# Patient Record
Sex: Male | Born: 1940 | Race: White | Hispanic: No | Smoking: Former smoker
Health system: Southern US, Community
[De-identification: ages and names within clinical notes are randomized; demographics above are authoritative.]

## PROBLEM LIST (undated history)

## (undated) DIAGNOSIS — B019 Varicella without complication: Secondary | ICD-10-CM

## (undated) DIAGNOSIS — K2270 Barrett's esophagus without dysplasia: Secondary | ICD-10-CM

## (undated) DIAGNOSIS — M1990 Unspecified osteoarthritis, unspecified site: Secondary | ICD-10-CM

## (undated) DIAGNOSIS — K219 Gastro-esophageal reflux disease without esophagitis: Secondary | ICD-10-CM

## (undated) DIAGNOSIS — K2970 Gastritis, unspecified, without bleeding: Secondary | ICD-10-CM

## (undated) DIAGNOSIS — Z8719 Personal history of other diseases of the digestive system: Secondary | ICD-10-CM

## (undated) DIAGNOSIS — I2510 Atherosclerotic heart disease of native coronary artery without angina pectoris: Secondary | ICD-10-CM

## (undated) DIAGNOSIS — I10 Essential (primary) hypertension: Secondary | ICD-10-CM

## (undated) DIAGNOSIS — T7840XA Allergy, unspecified, initial encounter: Secondary | ICD-10-CM

## (undated) DIAGNOSIS — H9190 Unspecified hearing loss, unspecified ear: Secondary | ICD-10-CM

## (undated) DIAGNOSIS — F419 Anxiety disorder, unspecified: Secondary | ICD-10-CM

## (undated) DIAGNOSIS — E785 Hyperlipidemia, unspecified: Secondary | ICD-10-CM

## (undated) DIAGNOSIS — K5790 Diverticulosis of intestine, part unspecified, without perforation or abscess without bleeding: Secondary | ICD-10-CM

## (undated) DIAGNOSIS — J45909 Unspecified asthma, uncomplicated: Secondary | ICD-10-CM

## (undated) DIAGNOSIS — C801 Malignant (primary) neoplasm, unspecified: Secondary | ICD-10-CM

## (undated) DIAGNOSIS — K649 Unspecified hemorrhoids: Secondary | ICD-10-CM

## (undated) DIAGNOSIS — Z972 Presence of dental prosthetic device (complete) (partial): Secondary | ICD-10-CM

## (undated) DIAGNOSIS — H409 Unspecified glaucoma: Secondary | ICD-10-CM

## (undated) DIAGNOSIS — L509 Urticaria, unspecified: Secondary | ICD-10-CM

## (undated) HISTORY — PX: COLONOSCOPY: SHX5424

## (undated) HISTORY — PX: CARPAL TUNNEL RELEASE: SHX101

## (undated) HISTORY — PX: BACK SURGERY: SHX140

## (undated) HISTORY — PX: LUMBAR SPINE SURGERY: SHX701

---

## 2006-01-14 ENCOUNTER — Ambulatory Visit: Payer: Self-pay | Admitting: Specialist

## 2006-01-14 ENCOUNTER — Other Ambulatory Visit: Payer: Self-pay

## 2006-02-05 ENCOUNTER — Ambulatory Visit: Payer: Self-pay | Admitting: Specialist

## 2007-07-22 ENCOUNTER — Ambulatory Visit: Payer: Self-pay | Admitting: Gastroenterology

## 2009-06-04 ENCOUNTER — Ambulatory Visit: Payer: Self-pay | Admitting: Internal Medicine

## 2009-06-06 ENCOUNTER — Ambulatory Visit: Payer: Self-pay | Admitting: Internal Medicine

## 2009-06-13 ENCOUNTER — Ambulatory Visit: Payer: Self-pay | Admitting: Internal Medicine

## 2009-07-18 ENCOUNTER — Ambulatory Visit: Payer: Self-pay | Admitting: Gastroenterology

## 2010-01-19 IMAGING — RF DG BARIUM SWALLOW
1 series · 15 of 21 positions shown · non-contrast
Comparison: none

REASON FOR EXAM: w tablet   dysphagia
COMMENTS:

PROCEDURE:     FL  - FL BARIUM SWALLOW  - July 18, 2009  [DATE]
RESULT:     Comparison: None
INDICATION: Dysphagia
TECHNIQUE: Multiple single and double-contrast images of the esophagus were
obtained under fluoroscopic guidance. Total fluoroscopy time was 1.0 minutes.

[Series 1: run · 6 acquisitions, 15 frames shown]
[im 1/6]
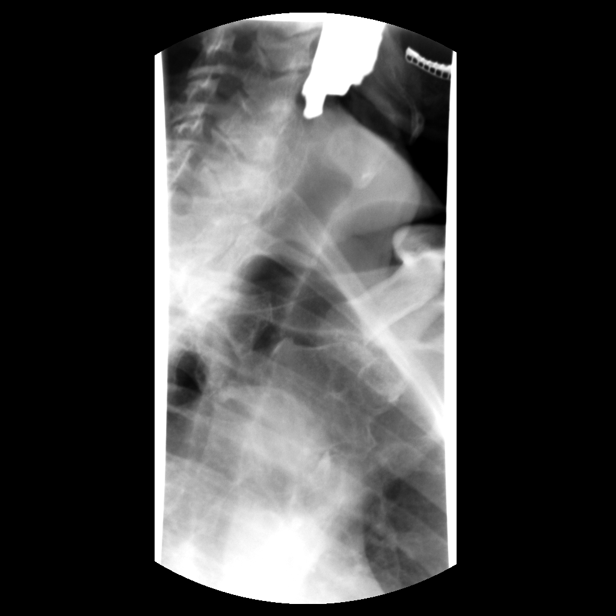
[im 1/6]
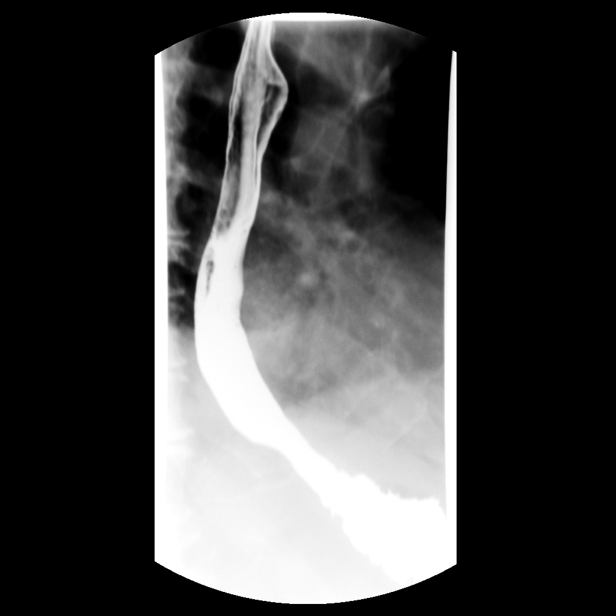
[im 1/6]
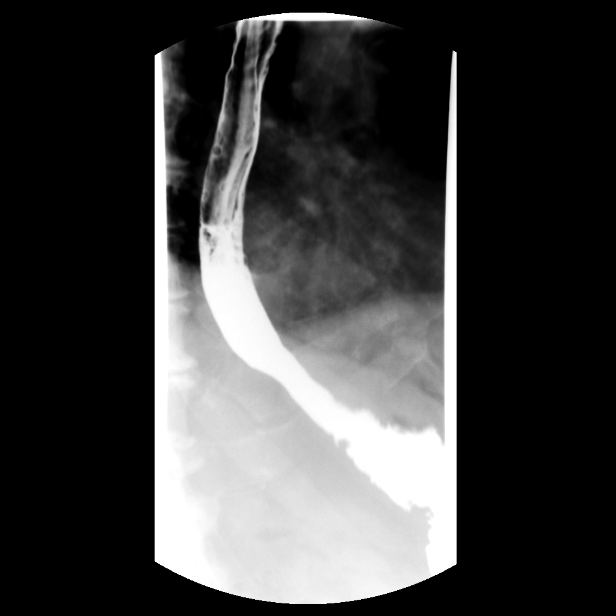
[im 2/6]
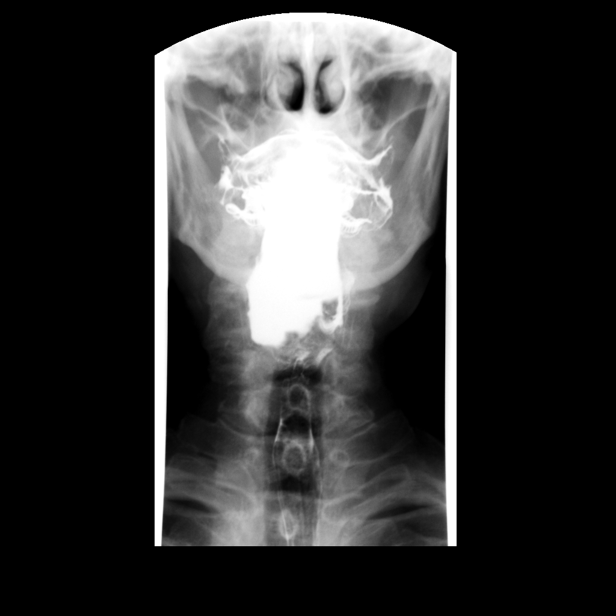
[im 2/6]
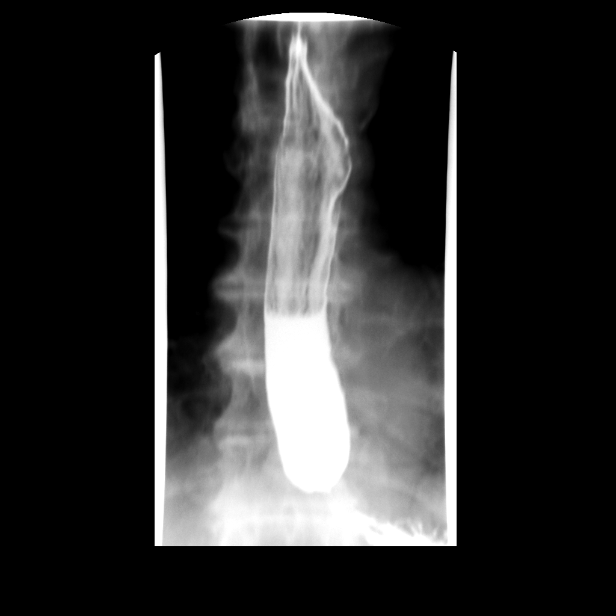
[im 2/6]
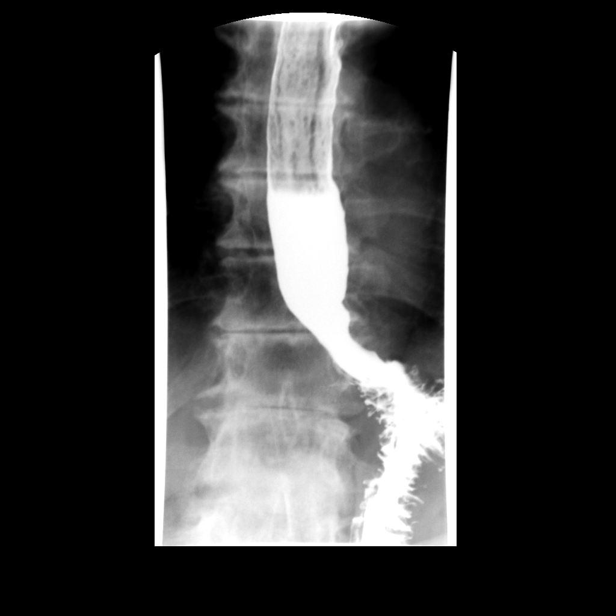
[im 3/6]
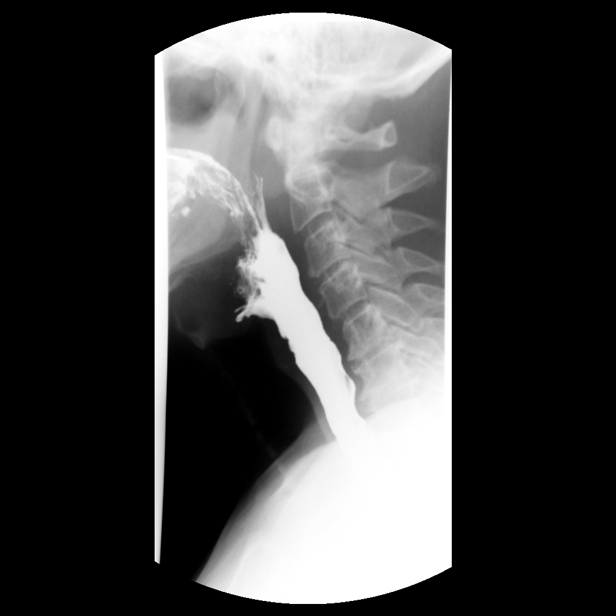
[im 3/6]
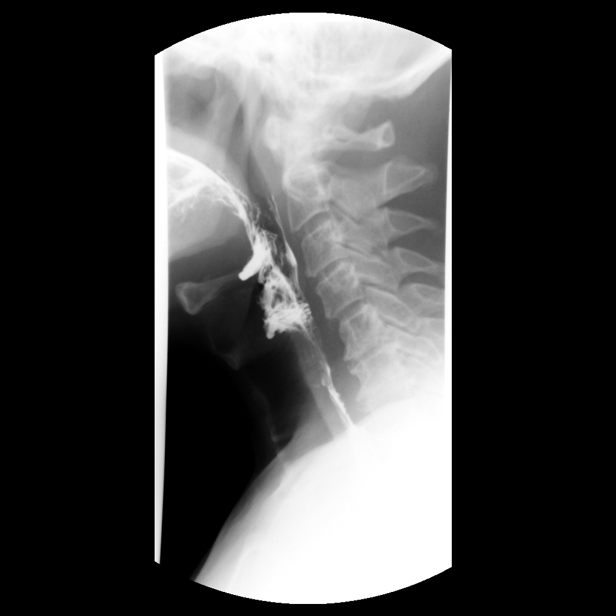
[im 3/6]
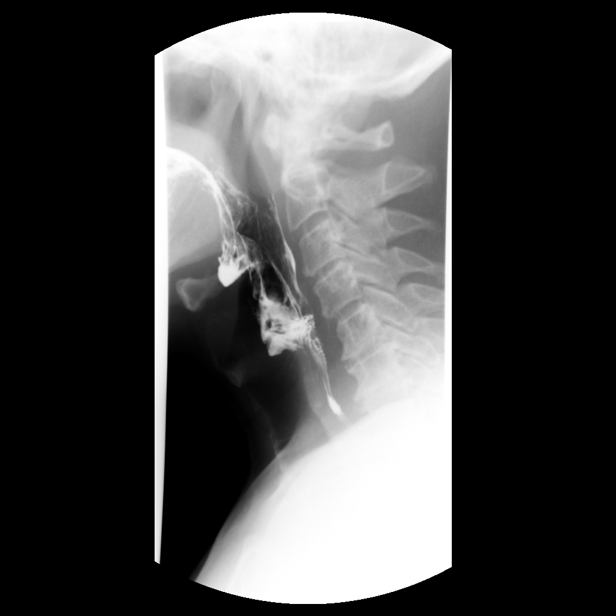
[im 4/6]
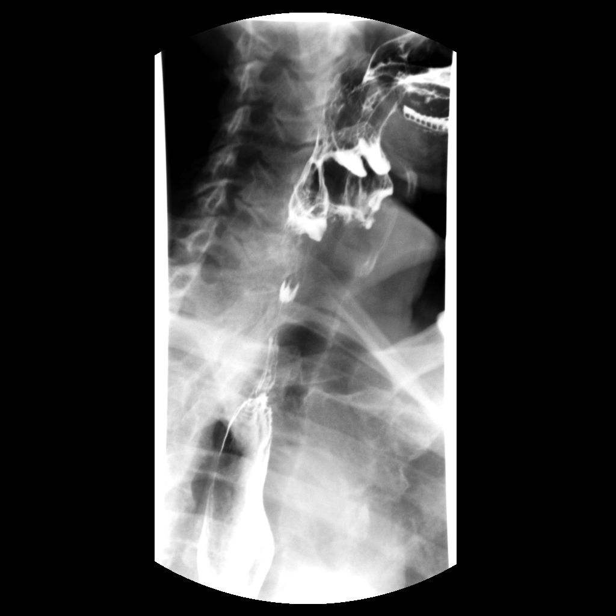
[im 4/6]
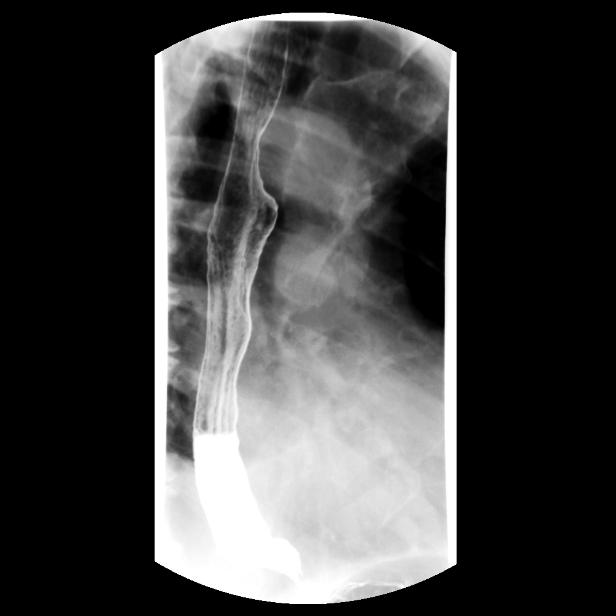
[im 5/6]
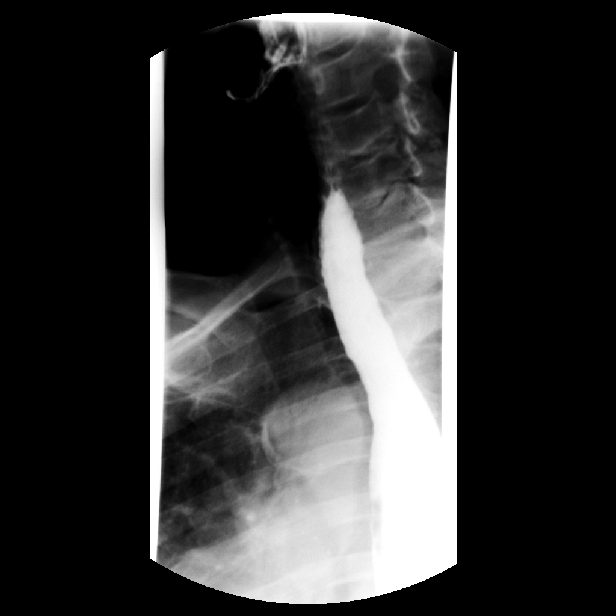
[im 5/6]
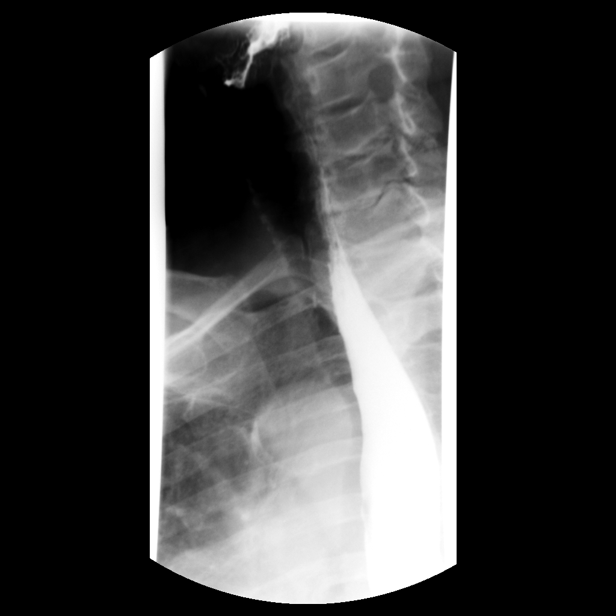
[im 5/6]
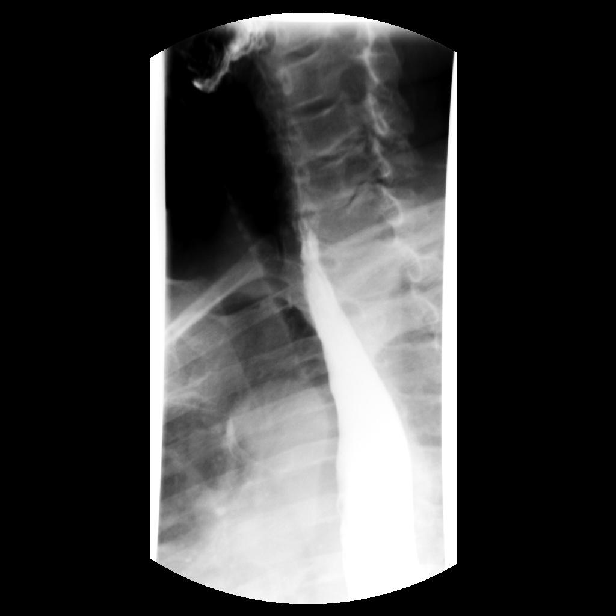
[im 6/6]
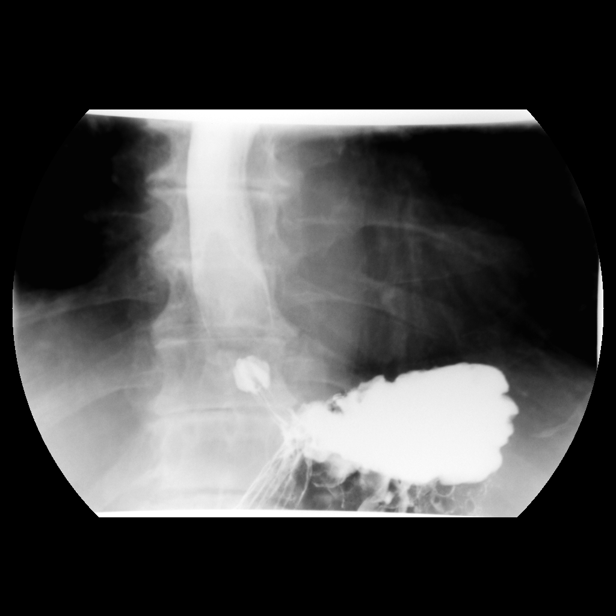

[15 of 21 positions shown; findings below may reference images not displayed]

FINDINGS: There was normal pharyngeal anatomy and motility. Contrast flowed
freely through the esophagus without evidence of stricture or mass. There
was normal esophageal mucosa without evidence of irregularity or ulceration.
 There are tertiary contractions of the distal third of the esophagus. There
is no gastroesophageal reflux. No definite hiatal hernia was demonstrated.

At the end of the examination a 12.5mm barium tablet was administered which
transited through the esophagus and esophagogastric junction without delay.
IMPRESSION: Tertiary contractions of the distal third of the esophagus likely reflecting
presbyesophagus.

## 2010-10-03 ENCOUNTER — Ambulatory Visit: Payer: Self-pay | Admitting: Gastroenterology

## 2010-10-07 LAB — PATHOLOGY REPORT

## 2011-06-12 ENCOUNTER — Emergency Department: Payer: Self-pay | Admitting: Emergency Medicine

## 2011-07-09 ENCOUNTER — Ambulatory Visit: Payer: Self-pay | Admitting: Internal Medicine

## 2011-12-14 IMAGING — CR DG CHEST 2V
1 series · 2 of 2 positions shown · non-contrast
Comparison: none

REASON FOR EXAM: chest pain
COMMENTS:

PROCEDURE:     DXR - DXR CHEST PA (OR AP) AND LATERAL  - June 12, 2011 [DATE]
RESULT:     The lung fields are clear. The heart, mediastinal and osseous
structures show no significant abnormalities.

[Series 1: pa · 0.17mm/px · 2 of 2 slices shown]
[im 1/2]
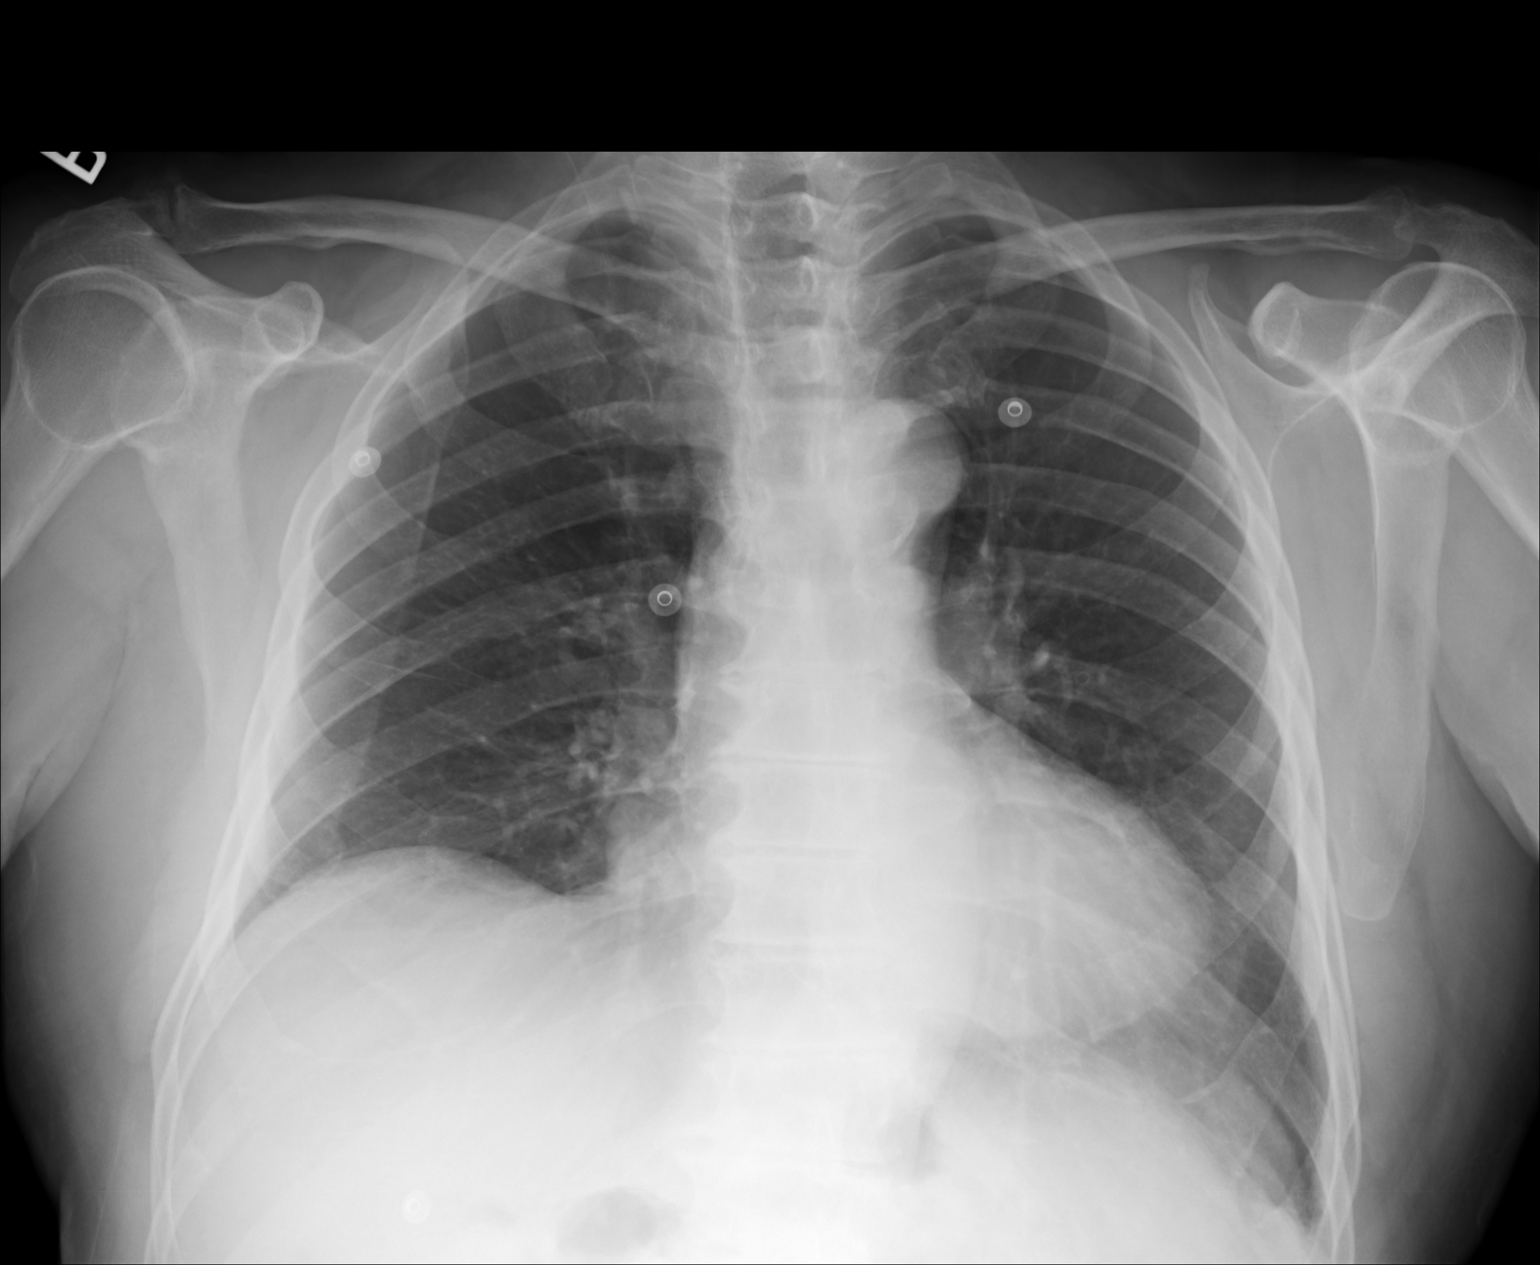
[im 2/2]
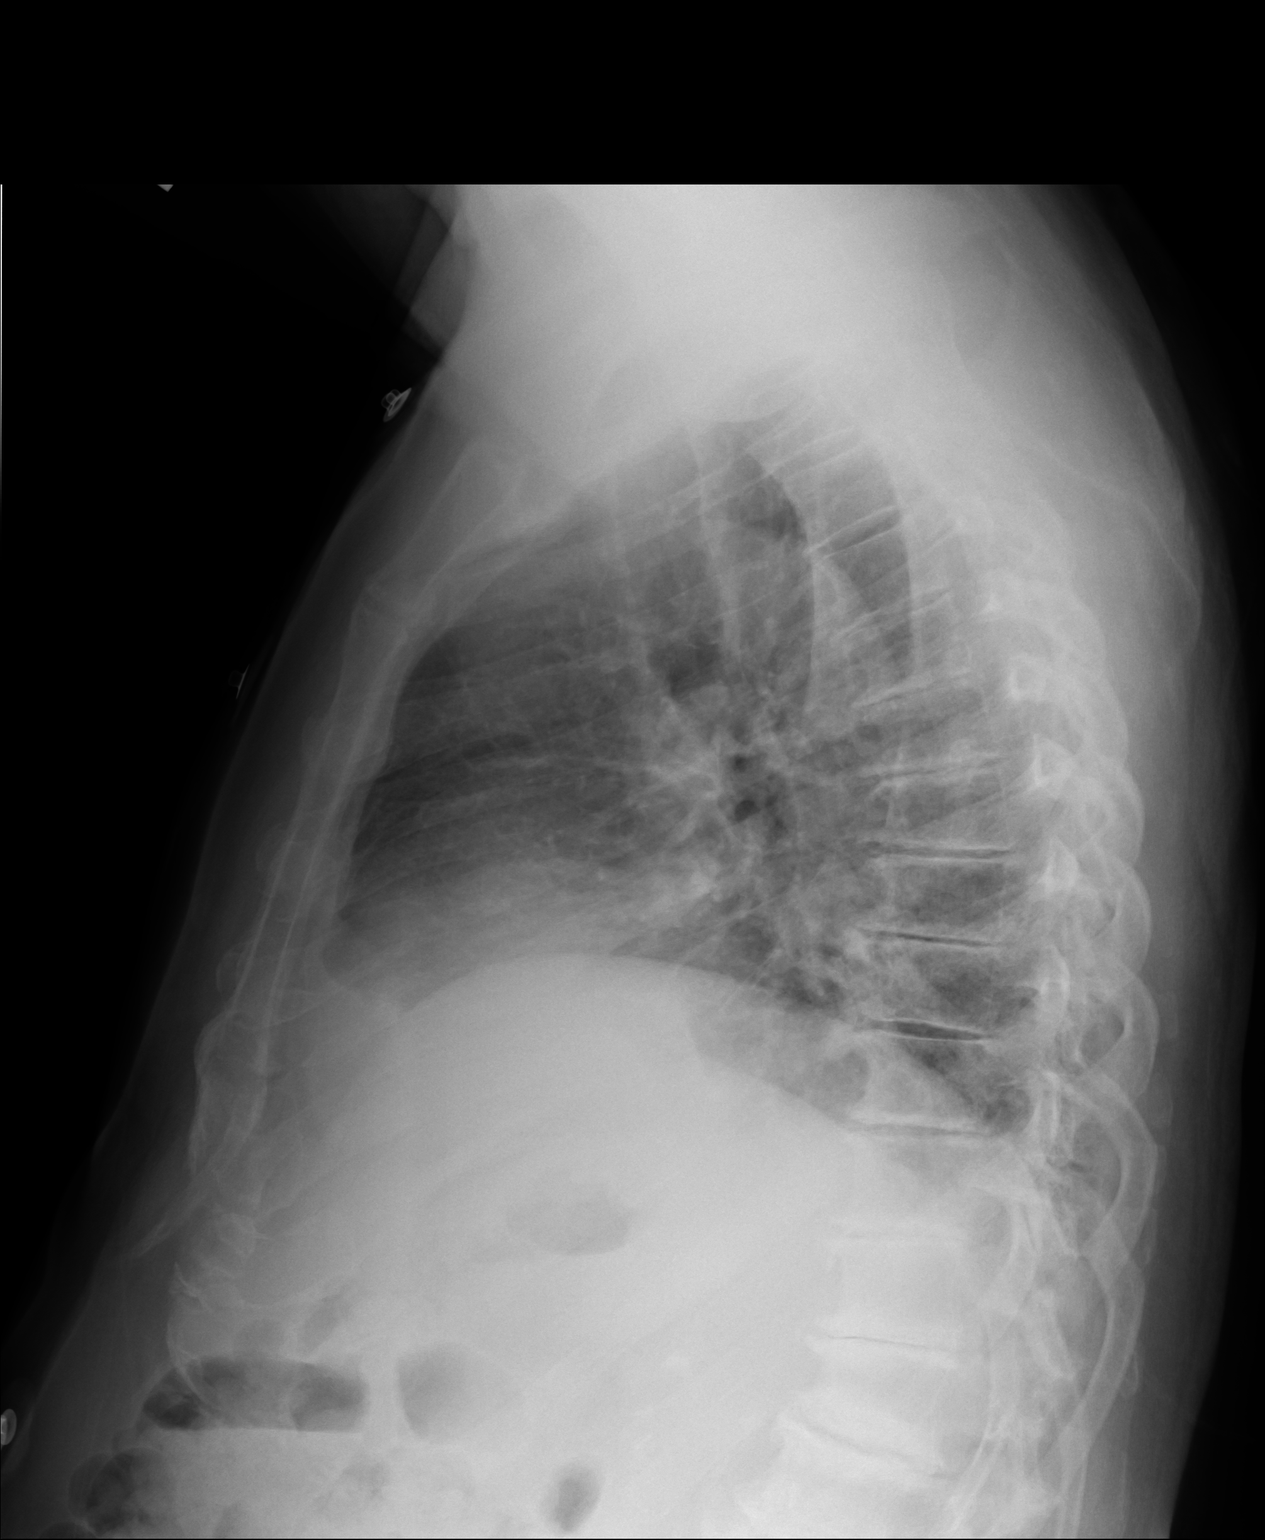

[2 of 2 positions shown; findings below may reference images not displayed]

IMPRESSION: 1.     No significant abnormalities are identified.

## 2014-04-30 ENCOUNTER — Ambulatory Visit: Payer: Self-pay | Admitting: Gastroenterology

## 2014-05-08 ENCOUNTER — Ambulatory Visit: Payer: Self-pay | Admitting: Gastroenterology

## 2014-10-29 LAB — SURGICAL PATHOLOGY

## 2014-11-01 IMAGING — RF DG BARIUM SWALLOW
2 series · 15 of 17 positions shown · non-contrast
Comparison: 07/18/2009

CLINICAL DATA: Dysphasia.

EXAM:
ESOPHOGRAM / BARIUM SWALLOW / BARIUM TABLET STUDY
TECHNIQUE: Combined double contrast and single contrast examination performed
using effervescent crystals, thick barium liquid, and thin barium
liquid. The patient was observed with fluoroscopy swallowing a 13mm
barium sulphate tablet.
FLUOROSCOPY TIME:  Two Min and 0 seconds

[Series 1: fluoro_barium 2fps_bw · 0.20mm/px · 4 of 20 frames shown]
[frame 4/20]
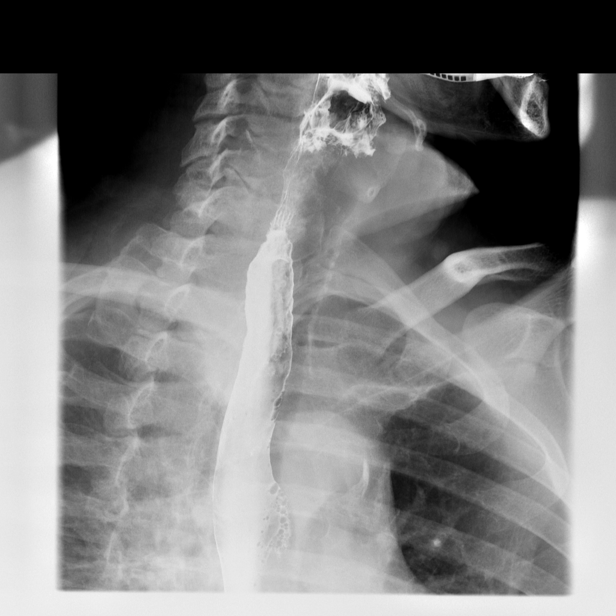
[frame 10/20]
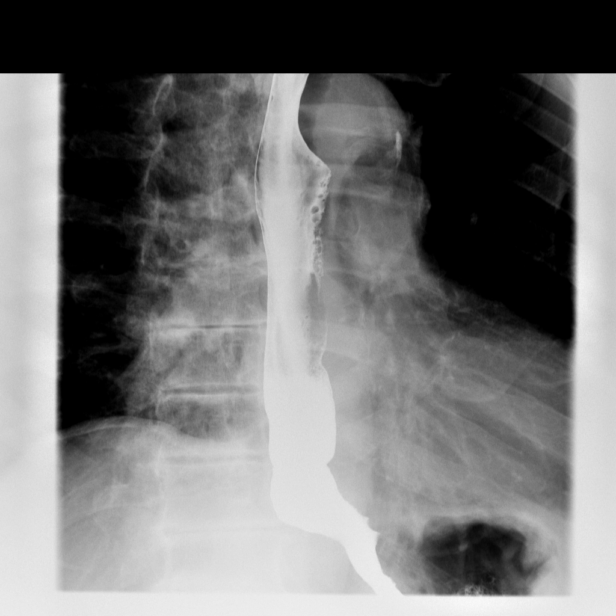
[frame 11/20]
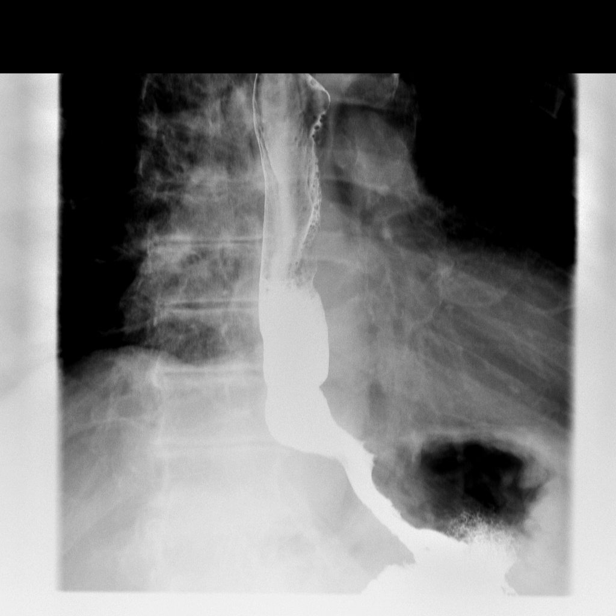
[frame 18/20]
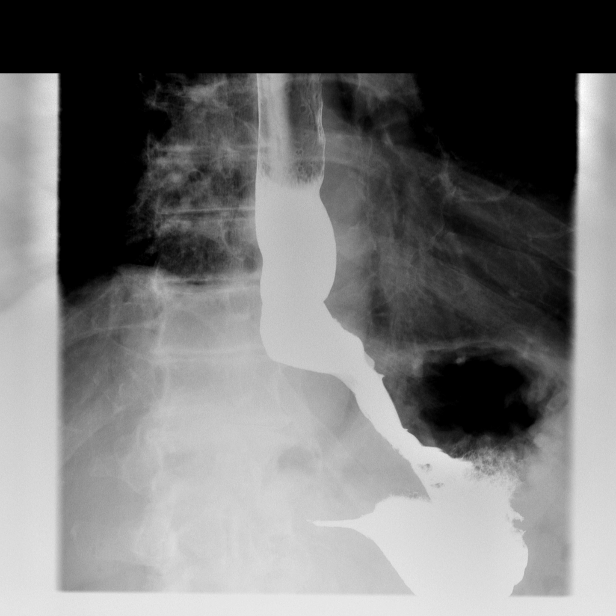

[Series 2: fluoro_barium singleshot_bw · 0.20mm/px · 11 of 13 slices shown]
[im 2/13]
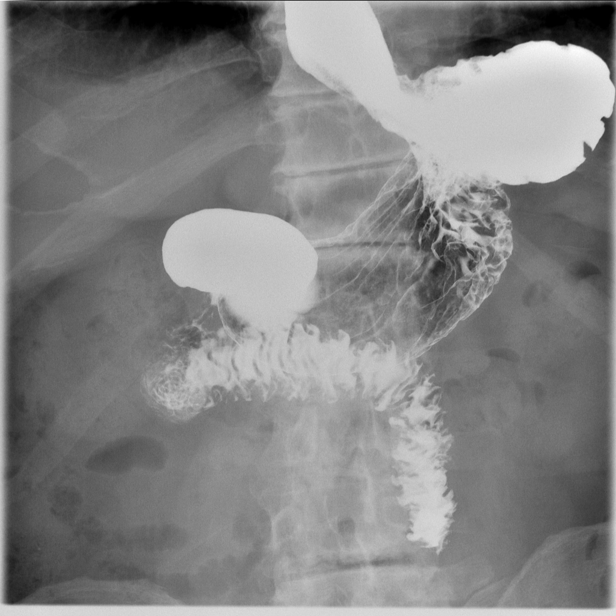
[im 3/13]
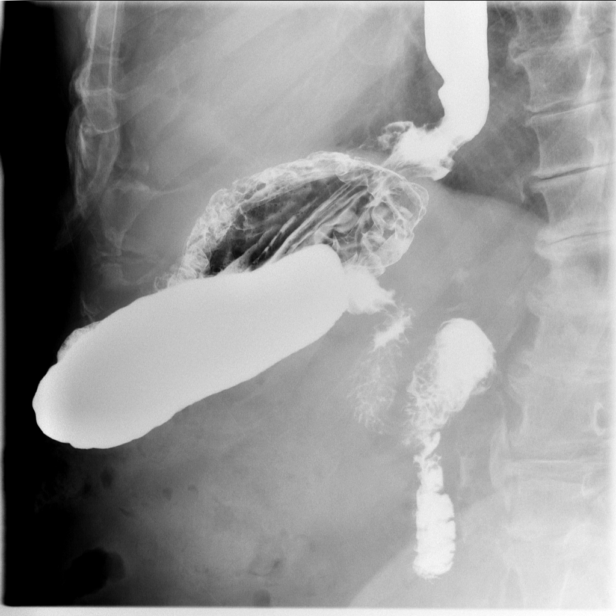
[im 4/13]
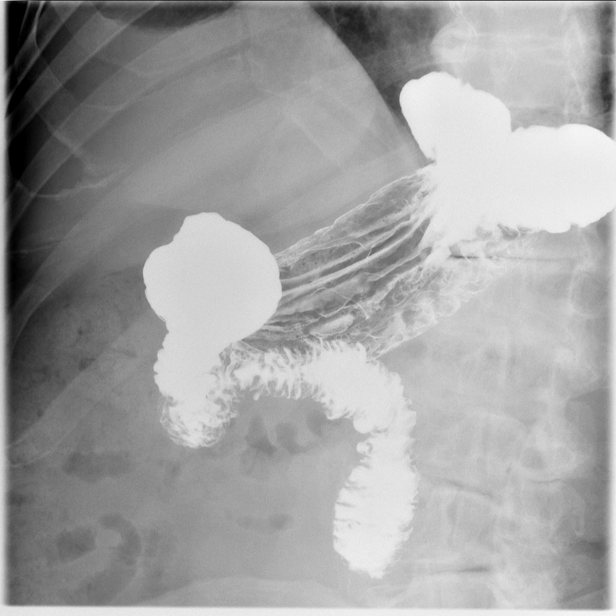
[im 5/13]
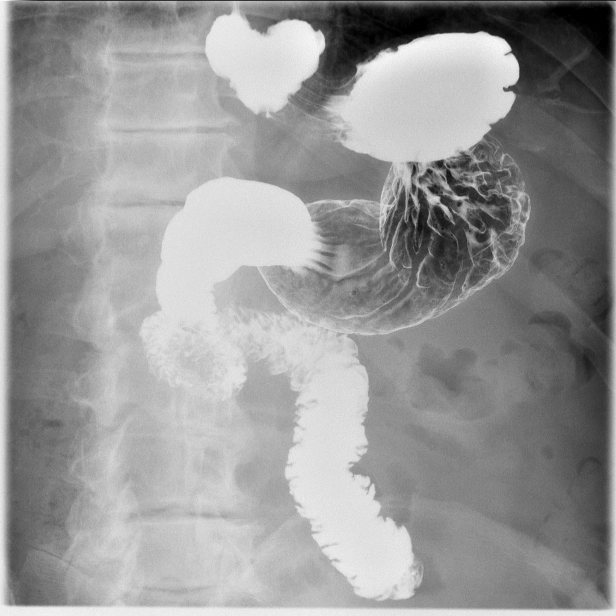
[im 6/13]
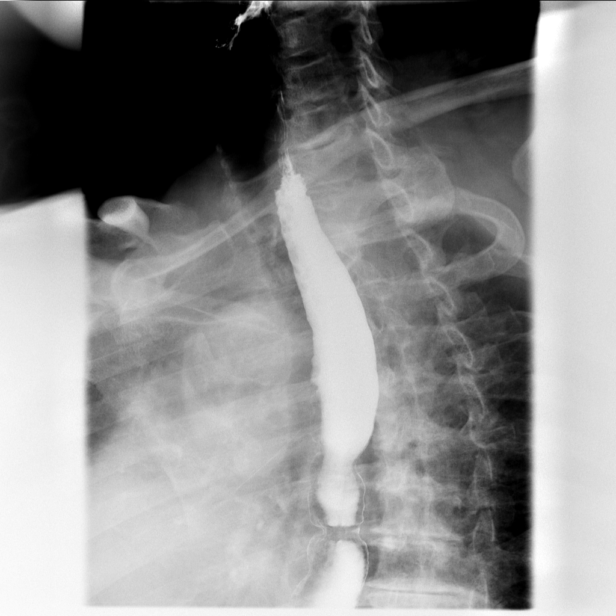
[im 7/13]
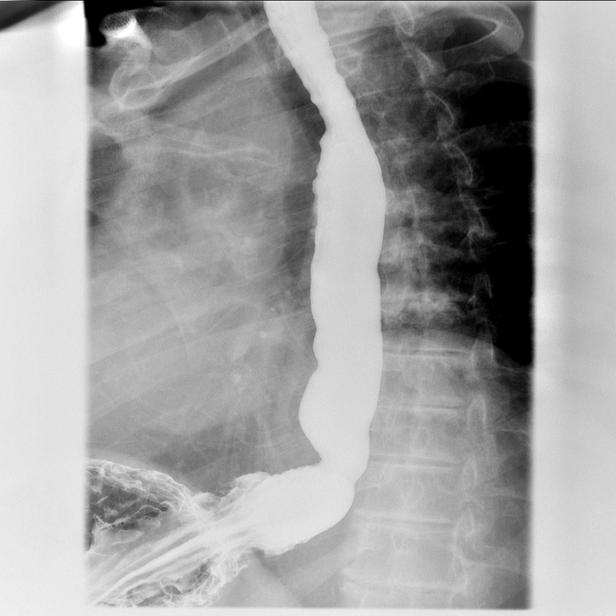
[im 8/13]
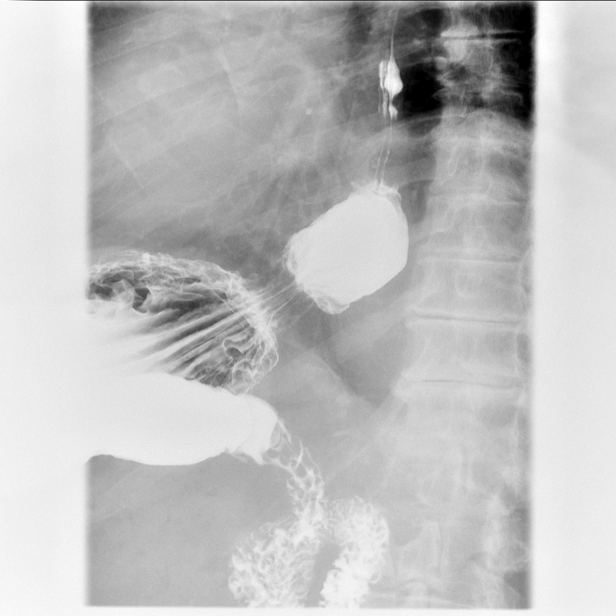
[im 10/13]
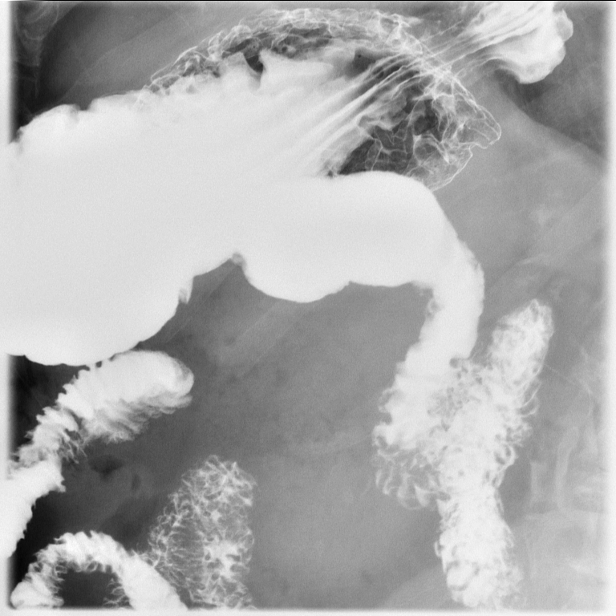
[im 11/13]
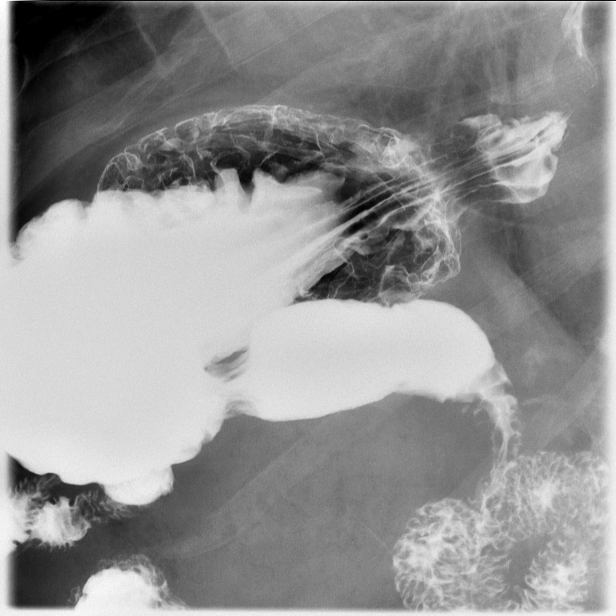
[im 12/13]
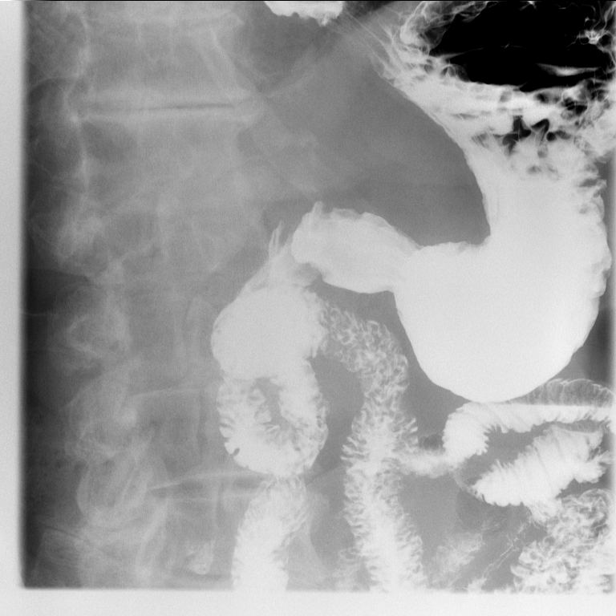
[im 13/13]
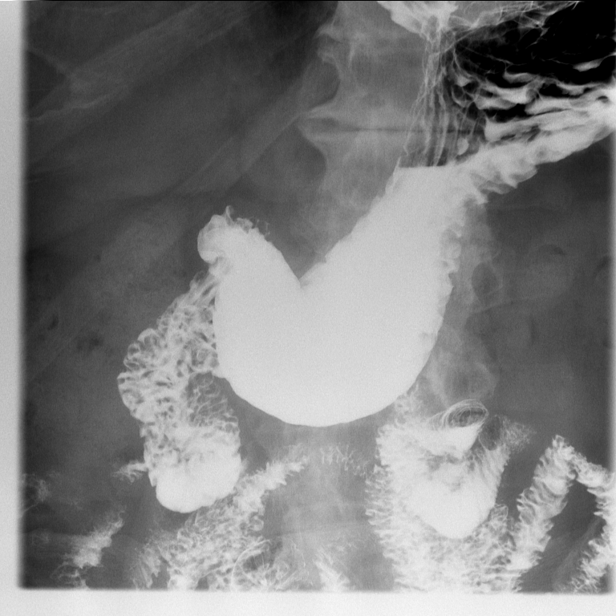

[15 of 17 positions shown; findings below may reference images not displayed]

FINDINGS: Moderately large hiatal hernia with free gastroesophageal reflux to
the mid-proximal esophagus. Decreased esophageal motility. No
esophageal stricture or mass. Barium tablet passed readily into the
stomach without delay

Gastric mucosa is normal. No mucosal edema. No ulcer or mass.
Stomach empties readily into the duodenum which appears normal
without ulceration or edema.
IMPRESSION: Moderately large hiatal hernia with free gastroesophageal reflux.
Decreased esophageal motility. No esophageal stricture.

Negative for peptic ulcer disease.

## 2015-07-15 ENCOUNTER — Ambulatory Visit: Admission: RE | Admit: 2015-07-15 | Payer: Medicare Other | Source: Ambulatory Visit | Admitting: Gastroenterology

## 2015-07-15 ENCOUNTER — Encounter: Admission: RE | Payer: Self-pay | Source: Ambulatory Visit

## 2015-07-15 HISTORY — DX: Anxiety disorder, unspecified: F41.9

## 2015-07-15 HISTORY — DX: Personal history of other diseases of the digestive system: Z87.19

## 2015-07-15 HISTORY — DX: Unspecified osteoarthritis, unspecified site: M19.90

## 2015-07-15 HISTORY — DX: Hyperlipidemia, unspecified: E78.5

## 2015-07-15 HISTORY — DX: Gastro-esophageal reflux disease without esophagitis: K21.9

## 2015-07-15 HISTORY — DX: Allergy, unspecified, initial encounter: T78.40XA

## 2015-07-15 HISTORY — DX: Diverticulosis of intestine, part unspecified, without perforation or abscess without bleeding: K57.90

## 2015-07-15 HISTORY — DX: Essential (primary) hypertension: I10

## 2015-07-15 HISTORY — DX: Urticaria, unspecified: L50.9

## 2015-07-15 HISTORY — DX: Gastritis, unspecified, without bleeding: K29.70

## 2015-07-15 HISTORY — DX: Barrett's esophagus without dysplasia: K22.70

## 2015-07-15 HISTORY — DX: Atherosclerotic heart disease of native coronary artery without angina pectoris: I25.10

## 2015-07-15 HISTORY — DX: Unspecified glaucoma: H40.9

## 2015-07-15 HISTORY — DX: Unspecified hemorrhoids: K64.9

## 2015-07-15 HISTORY — DX: Varicella without complication: B01.9

## 2015-07-15 HISTORY — DX: Malignant (primary) neoplasm, unspecified: C80.1

## 2015-07-15 SURGERY — ESOPHAGOGASTRODUODENOSCOPY (EGD) WITH PROPOFOL
Anesthesia: General

## 2015-08-27 ENCOUNTER — Encounter
Admission: RE | Disposition: A | Discharge status: Home / Self Care | Payer: Self-pay | Source: Ambulatory Visit | Attending: Gastroenterology

## 2015-08-27 ENCOUNTER — Ambulatory Visit: Payer: Medicare Other | Admitting: Anesthesiology

## 2015-08-27 ENCOUNTER — Ambulatory Visit
Admission: RE | Admit: 2015-08-27 | Discharge: 2015-08-27 | Disposition: A | Discharge status: Home / Self Care | Payer: Medicare Other | Source: Ambulatory Visit | Attending: Gastroenterology | Admitting: Gastroenterology

## 2015-08-27 ENCOUNTER — Encounter: Payer: Self-pay | Admitting: *Deleted

## 2015-08-27 DIAGNOSIS — K2970 Gastritis, unspecified, without bleeding: Secondary | ICD-10-CM | POA: Diagnosis not present

## 2015-08-27 DIAGNOSIS — Z809 Family history of malignant neoplasm, unspecified: Secondary | ICD-10-CM | POA: Diagnosis not present

## 2015-08-27 DIAGNOSIS — M1990 Unspecified osteoarthritis, unspecified site: Secondary | ICD-10-CM | POA: Diagnosis not present

## 2015-08-27 DIAGNOSIS — Z7982 Long term (current) use of aspirin: Secondary | ICD-10-CM | POA: Insufficient documentation

## 2015-08-27 DIAGNOSIS — I2510 Atherosclerotic heart disease of native coronary artery without angina pectoris: Secondary | ICD-10-CM | POA: Diagnosis not present

## 2015-08-27 DIAGNOSIS — E785 Hyperlipidemia, unspecified: Secondary | ICD-10-CM | POA: Diagnosis not present

## 2015-08-27 DIAGNOSIS — H409 Unspecified glaucoma: Secondary | ICD-10-CM | POA: Diagnosis not present

## 2015-08-27 DIAGNOSIS — I119 Hypertensive heart disease without heart failure: Secondary | ICD-10-CM | POA: Insufficient documentation

## 2015-08-27 DIAGNOSIS — F419 Anxiety disorder, unspecified: Secondary | ICD-10-CM | POA: Diagnosis not present

## 2015-08-27 DIAGNOSIS — K319 Disease of stomach and duodenum, unspecified: Secondary | ICD-10-CM | POA: Insufficient documentation

## 2015-08-27 DIAGNOSIS — Z833 Family history of diabetes mellitus: Secondary | ICD-10-CM | POA: Insufficient documentation

## 2015-08-27 DIAGNOSIS — Z888 Allergy status to other drugs, medicaments and biological substances status: Secondary | ICD-10-CM | POA: Diagnosis not present

## 2015-08-27 DIAGNOSIS — Z79899 Other long term (current) drug therapy: Secondary | ICD-10-CM | POA: Diagnosis not present

## 2015-08-27 DIAGNOSIS — K2270 Barrett's esophagus without dysplasia: Secondary | ICD-10-CM | POA: Insufficient documentation

## 2015-08-27 DIAGNOSIS — Z85828 Personal history of other malignant neoplasm of skin: Secondary | ICD-10-CM | POA: Diagnosis not present

## 2015-08-27 DIAGNOSIS — Z87891 Personal history of nicotine dependence: Secondary | ICD-10-CM | POA: Diagnosis not present

## 2015-08-27 DIAGNOSIS — Z8042 Family history of malignant neoplasm of prostate: Secondary | ICD-10-CM | POA: Insufficient documentation

## 2015-08-27 DIAGNOSIS — Z8371 Family history of colonic polyps: Secondary | ICD-10-CM | POA: Insufficient documentation

## 2015-08-27 DIAGNOSIS — K449 Diaphragmatic hernia without obstruction or gangrene: Secondary | ICD-10-CM | POA: Insufficient documentation

## 2015-08-27 DIAGNOSIS — K219 Gastro-esophageal reflux disease without esophagitis: Secondary | ICD-10-CM | POA: Insufficient documentation

## 2015-08-27 HISTORY — PX: ESOPHAGOGASTRODUODENOSCOPY (EGD) WITH PROPOFOL: SHX5813

## 2015-08-27 SURGERY — ESOPHAGOGASTRODUODENOSCOPY (EGD) WITH PROPOFOL
Anesthesia: General

## 2015-08-27 MED ORDER — IPRATROPIUM-ALBUTEROL 0.5-2.5 (3) MG/3ML IN SOLN
3.0000 mL | Freq: Once | RESPIRATORY_TRACT | Status: AC
  Administered 2015-08-27: 3 mL via RESPIRATORY_TRACT

## 2015-08-27 MED ORDER — LIDOCAINE HCL (CARDIAC) 20 MG/ML IV SOLN
INTRAVENOUS | Status: DC | PRN
  Administered 2015-08-27: 100 mg via INTRAVENOUS

## 2015-08-27 MED ORDER — GLYCOPYRROLATE 0.2 MG/ML IJ SOLN
INTRAMUSCULAR | Status: DC | PRN
  Administered 2015-08-27: 0.2 mg via INTRAVENOUS

## 2015-08-27 MED ORDER — PROPOFOL 500 MG/50ML IV EMUL
INTRAVENOUS | Status: DC | PRN
  Administered 2015-08-27: 120 ug/kg/min via INTRAVENOUS

## 2015-08-27 MED ORDER — SODIUM CHLORIDE 0.9 % IV SOLN: INTRAVENOUS | Status: DC

## 2015-08-27 MED ORDER — IPRATROPIUM-ALBUTEROL 0.5-2.5 (3) MG/3ML IN SOLN
RESPIRATORY_TRACT | Status: AC
  Administered 2015-08-27: 3 mL via RESPIRATORY_TRACT
  Filled 2015-08-27: qty 3

## 2015-08-27 MED ORDER — PROPOFOL 10 MG/ML IV BOLUS
INTRAVENOUS | Status: DC | PRN
  Administered 2015-08-27: 25 mg via INTRAVENOUS
  Administered 2015-08-27: 55 mg via INTRAVENOUS

## 2015-08-27 MED ORDER — PHENYLEPHRINE HCL 10 MG/ML IJ SOLN
INTRAMUSCULAR | Status: DC | PRN
  Administered 2015-08-27: 200 ug via INTRAVENOUS

## 2015-08-27 MED ORDER — SODIUM CHLORIDE 0.9 % IV SOLN
INTRAVENOUS | Status: DC
  Administered 2015-08-27: 09:00:00 via INTRAVENOUS
  Administered 2015-08-27: 1000 mL via INTRAVENOUS

## 2015-08-27 NOTE — Op Note (Signed)
[facility] Gastroenterology Patient Name: Jason Bond Procedure Date: 08/27/2015 8:29 AM MRN: [id number] Account #: [id number] Date of Birth: 04/02/1941 Admit Type: Outpatient Age: 75 Room: [facility] ENDO ROOM 3 Gender: Male Note Status: Finalized Procedure:            Upper GI endoscopy Indications:          Follow-up of Barrett's esophagus Providers:            Lollie Sails, MD Referring MD:         Irven Easterly. Kary Kos, MD (Referring MD) Medicines:            Monitored Anesthesia Care Complications:        No immediate complications. Procedure:            Pre-Anesthesia Assessment:                       - ASA Grade Assessment: III - A patient with severe                        systemic disease.                       After obtaining informed consent, the endoscope was                        passed under direct vision. Throughout the procedure,                        the patient's blood pressure, pulse, and oxygen                        saturations were monitored continuously. The Endoscope                        was introduced through the mouth, and advanced to the                        third part of duodenum. The upper GI endoscopy was                        accomplished without difficulty. The patient tolerated                        the procedure well. Findings:      The Z-line was irregular.      There were esophageal mucosal changes consistent with short-segment       Barrett's esophagus present at the gastroesophageal junction. The       maximum longitudinal extent of these mucosal changes was 1 cm in length.       Mucosa was biopsied with a cold forceps for histology in 4 quadrants.      The lower third of the esophagus was significantly tortuous.      Patchy minimal inflammation characterized by erythema was found in the       gastric body. Biopsies were taken with a cold forceps for histology.      The examined duodenum was normal.      A small  hiatal hernia was present.      The cardia and gastric fundus were normal on retroflexion otherwise. Impression:           -  Z-line irregular.                       - Esophageal mucosal changes consistent with                        short-segment Barrett's esophagus. Biopsied.                       - Tortuous esophagus.                       - Gastritis. Biopsied.                       - Normal examined duodenum.                       - Small hiatal hernia. Recommendation:       - Await pathology results. Procedure Code(s):    --- Professional ---                       [phone], Esophagogastroduodenoscopy, flexible, transoral;                        with biopsy, single or multiple Diagnosis Code(s):    --- Professional ---                       K22.8, Other specified diseases of esophagus                       K22.70, Barrett's esophagus without dysplasia                       Q39.9, Congenital malformation of esophagus, unspecified                       K29.70, Gastritis, unspecified, without bleeding                       K44.9, Diaphragmatic hernia without obstruction or                        gangrene CPT copyright 2016 [employer]. All rights reserved. The codes documented in this report are preliminary and upon coder review may  be revised to meet current compliance requirements. Lollie Sails, MD 08/27/2015 8:49:59 AM This report has been signed electronically. Number of Addenda: 0 Note Initiated On: 08/27/2015 8:29 AM      [facility]

## 2015-08-27 NOTE — Anesthesia Postprocedure Evaluation (Signed)
Anesthesia Post Note  Patient: POLO TORCHIA  Procedure(s) Performed: Procedure(s) (LRB): ESOPHAGOGASTRODUODENOSCOPY (EGD) WITH PROPOFOL (N/A)  Patient location during evaluation: Endoscopy Anesthesia Type: General Level of consciousness: awake and alert Pain management: pain level controlled Vital Signs Assessment: post-procedure vital signs reviewed and stable Respiratory status: spontaneous breathing, nonlabored ventilation, respiratory function stable and patient connected to nasal cannula oxygen Cardiovascular status: blood pressure returned to baseline and stable Postop Assessment: no signs of nausea or vomiting Anesthetic complications: no    Last Vitals:  Filed Vitals:   08/27/15 0852 08/27/15 0900  BP: 108/64 92/55  Pulse: 95   Temp: 36 C   Resp: 21     Last Pain: There were no vitals filed for this visit.               Precious Haws Piscitello

## 2015-08-27 NOTE — H&P (Signed)
Outpatient short stay form Pre-procedure 08/27/2015 8:28 AM Jason Sails MD  Primary Physician: Dr. Kary Kos  Reason for visit:  EGD  History of present illness:  Patient is a 75 year old male presenting today for follow-up EGD in regards to his personal history of Barrett's esophagus. He does take a personal 40 mg daily. He has no symptoms of heartburn or dysphagia. He also takes 1 g of Carafate twice a day.    Current facility-administered medications:  .  0.9 %  sodium chloride infusion, , Intravenous, Continuous, Jason Sails, MD, Last Rate: 20 mL/hr at 08/27/15 0728, 1,000 mL at 08/27/15 0728 .  0.9 %  sodium chloride infusion, , Intravenous, Continuous, Jason Sails, MD  Prescriptions prior to admission  Medication Sig Dispense Refill Last Dose  . acetaminophen (TYLENOL) 650 MG CR tablet Take 650 mg by mouth every 8 (eight) hours as needed for pain.   Past Week at Unknown time  . aspirin EC 81 MG tablet Take 81 mg by mouth daily.   Past Week at Unknown time  . azelastine (ASTELIN) 0.1 % nasal spray Place 1 spray into both nostrils 2 (two) times daily. Use in each nostril as directed   08/26/2015 at Unknown time  . brimonidine (ALPHAGAN) 0.2 % ophthalmic solution Place 1 drop into both eyes 3 (three) times daily.   08/26/2015 at Unknown time  . calcium carbonate (TUMS - DOSED IN MG ELEMENTAL CALCIUM) 500 MG chewable tablet Chew 1 tablet by mouth 2 (two) times daily with a meal.   Past Week at Unknown time  . celecoxib (CELEBREX) 200 MG capsule Take 200 mg by mouth 2 (two) times daily.   Past Week at Unknown time  . clobetasol cream (TEMOVATE) [id number] % Apply 1 application topically 2 (two) times daily.   08/26/2015 at Unknown time  . latanoprost (XALATAN) 0.005 % ophthalmic solution Place 1 drop into both eyes at bedtime.   08/26/2015 at Unknown time  . levocetirizine (XYZAL) 5 MG tablet Take 5 mg by mouth every evening.   08/26/2015 at Unknown time  .  lisinopril-hydrochlorothiazide (PRINZIDE,ZESTORETIC) 10-12.5 MG tablet Take 1 tablet by mouth daily.   08/27/2015 at 0600  . montelukast (SINGULAIR) 10 MG tablet Take 10 mg by mouth at bedtime.   08/26/2015 at Unknown time  . OMEGA-3 FATTY ACIDS PO Take 1,000 mg by mouth daily.   Past Week at Unknown time  . omeprazole (PRILOSEC) 40 MG capsule Take 40 mg by mouth daily.   08/26/2015 at Unknown time  . pravastatin (PRAVACHOL) 20 MG tablet Take 20 mg by mouth daily.   08/26/2015 at Unknown time  . sucralfate (CARAFATE) 1 g tablet Take 1 g by mouth 4 (four) times daily -  with meals and at bedtime.   08/26/2015 at Unknown time     Allergies  Allergen Reactions  . Pravastatin Sodium      Past Medical History  Diagnosis Date  . Allergic state   . Anxiety   . Barrett esophagus   . Coronary artery disease   . Chickenpox   . Diverticulosis   . Gastritis   . GERD (gastroesophageal reflux disease)   . Glaucoma   . Hemorrhoids   . History of hiatal hernia   . Hyperlipemia   . Hypertension   . Hives   . Arthritis   . Cancer [facility])     Review of systems:      Physical Exam    Heart and lungs: Regular  rate and rhythm without rub or gallop, lungs are bilaterally clear.    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    Other:     Pertinant exam for procedure: Soft nontender nondistended bowel sounds positive normoactive somewhat protuberant.    Planned proceedures: EGD and indicated procedures. I have discussed the risks benefits and complications of procedures to include not limited to bleeding, infection, perforation and the risk of sedation and the patient wishes to proceed.    Jason Sails, MD Gastroenterology 08/27/2015  8:28 AM

## 2015-08-27 NOTE — Anesthesia Preprocedure Evaluation (Signed)
Anesthesia Evaluation  Patient identified by MRN, date of birth, ID band Patient awake    Reviewed: Allergy & Precautions, H&P , NPO status , Patient's Chart, lab work & pertinent test results  History of Anesthesia Complications Negative for: history of anesthetic complications  Airway Mallampati: III  TM Distance: >3 FB Neck ROM: limited    Dental  (+) Poor Dentition, Chipped, Missing, Partial Lower, Upper Dentures   Pulmonary neg shortness of breath, COPD, former smoker,    Pulmonary exam normal breath sounds clear to auscultation       Cardiovascular Exercise Tolerance: Good hypertension, (-) angina+ CAD  (-) Past MI and (-) DOE Normal cardiovascular exam Rhythm:regular Rate:Normal     Neuro/Psych PSYCHIATRIC DISORDERS Anxiety negative neurological ROS     GI/Hepatic Neg liver ROS, hiatal hernia, GERD  Controlled,  Endo/Other  negative endocrine ROS  Renal/GU negative Renal ROS  negative genitourinary   Musculoskeletal  (+) Arthritis ,   Abdominal   Peds  Hematology negative hematology ROS (+)   Anesthesia Other Findings Past Medical History:   Allergic state                                               Anxiety                                                      Barrett esophagus                                            Coronary artery disease                                      Chickenpox                                                   Diverticulosis                                               Gastritis                                                    GERD (gastroesophageal reflux disease)                       Glaucoma  Hemorrhoids                                                  History of hiatal hernia                                     Hyperlipemia                                                 Hypertension                                                  Hives                                                        Arthritis                                                    Cancer ([place])                                                Past Surgical History:   LUMBAR SPINE SURGERY                                          CARPAL TUNNEL RELEASE                                         COLONOSCOPY                                                  BMI    Body Mass Index   29.75 kg/m 2      Reproductive/Obstetrics negative OB ROS                             Anesthesia Physical Anesthesia Plan  ASA: III  Anesthesia Plan: General   Post-op Pain Management:    Induction:   Airway Management Planned:   Additional Equipment:   Intra-op Plan:   Post-operative Plan:   Informed Consent: I have reviewed the patients History and Physical, chart, labs and discussed the procedure including the risks, benefits and alternatives for the  proposed anesthesia with the patient or authorized representative who has indicated his/her understanding and acceptance.   Dental Advisory Given  Plan Discussed with: Anesthesiologist, CRNA and Surgeon  Anesthesia Plan Comments:         Anesthesia Quick Evaluation

## 2015-08-27 NOTE — Transfer of Care (Signed)
Immediate Anesthesia Transfer of Care Note  Patient: Jason Bond  Procedure(s) Performed: Procedure(s): ESOPHAGOGASTRODUODENOSCOPY (EGD) WITH PROPOFOL (N/A)  Patient Location: PACU  Anesthesia Type:General  Level of Consciousness: sedated  Airway & Oxygen Therapy: Patient Spontanous Breathing and Patient connected to nasal cannula oxygen  Post-op Assessment: Report given to RN and Post -op Vital signs reviewed and stable  Post vital signs: Reviewed and stable  Last Vitals:  Filed Vitals:   08/27/15 0850 08/27/15 0852  BP: 108/64 108/64  Pulse: 70 95  Temp: 36 C 36 C  Resp: 20 21    Complications: No apparent anesthesia complications

## 2015-08-28 ENCOUNTER — Encounter: Payer: Self-pay | Admitting: Gastroenterology

## 2015-08-28 LAB — SURGICAL PATHOLOGY

## 2018-04-08 ENCOUNTER — Encounter: Payer: Self-pay | Admitting: *Deleted

## 2018-04-11 ENCOUNTER — Ambulatory Visit: Payer: Medicare Other | Admitting: Anesthesiology

## 2018-04-11 ENCOUNTER — Encounter
Admission: RE | Disposition: A | Discharge status: Home / Self Care | Payer: Self-pay | Source: Ambulatory Visit | Attending: Gastroenterology

## 2018-04-11 ENCOUNTER — Encounter: Payer: Self-pay | Admitting: *Deleted

## 2018-04-11 ENCOUNTER — Ambulatory Visit
Admission: RE | Admit: 2018-04-11 | Discharge: 2018-04-11 | Disposition: A | Discharge status: Home / Self Care | Payer: Medicare Other | Source: Ambulatory Visit | Attending: Gastroenterology | Admitting: Gastroenterology

## 2018-04-11 DIAGNOSIS — Z7982 Long term (current) use of aspirin: Secondary | ICD-10-CM | POA: Insufficient documentation

## 2018-04-11 DIAGNOSIS — I10 Essential (primary) hypertension: Secondary | ICD-10-CM | POA: Diagnosis not present

## 2018-04-11 DIAGNOSIS — Z85828 Personal history of other malignant neoplasm of skin: Secondary | ICD-10-CM | POA: Diagnosis not present

## 2018-04-11 DIAGNOSIS — K449 Diaphragmatic hernia without obstruction or gangrene: Secondary | ICD-10-CM | POA: Diagnosis not present

## 2018-04-11 DIAGNOSIS — Z79899 Other long term (current) drug therapy: Secondary | ICD-10-CM | POA: Insufficient documentation

## 2018-04-11 DIAGNOSIS — Z13810 Encounter for screening for upper gastrointestinal disorder: Secondary | ICD-10-CM | POA: Insufficient documentation

## 2018-04-11 DIAGNOSIS — E785 Hyperlipidemia, unspecified: Secondary | ICD-10-CM | POA: Diagnosis not present

## 2018-04-11 DIAGNOSIS — H409 Unspecified glaucoma: Secondary | ICD-10-CM | POA: Diagnosis not present

## 2018-04-11 DIAGNOSIS — Z791 Long term (current) use of non-steroidal anti-inflammatories (NSAID): Secondary | ICD-10-CM | POA: Diagnosis not present

## 2018-04-11 DIAGNOSIS — I2510 Atherosclerotic heart disease of native coronary artery without angina pectoris: Secondary | ICD-10-CM | POA: Diagnosis not present

## 2018-04-11 DIAGNOSIS — K2960 Other gastritis without bleeding: Secondary | ICD-10-CM | POA: Diagnosis not present

## 2018-04-11 DIAGNOSIS — Z87891 Personal history of nicotine dependence: Secondary | ICD-10-CM | POA: Diagnosis not present

## 2018-04-11 DIAGNOSIS — K210 Gastro-esophageal reflux disease with esophagitis: Secondary | ICD-10-CM | POA: Insufficient documentation

## 2018-04-11 HISTORY — PX: ESOPHAGOGASTRODUODENOSCOPY: SHX5428

## 2018-04-11 SURGERY — EGD (ESOPHAGOGASTRODUODENOSCOPY)
Anesthesia: General

## 2018-04-11 MED ORDER — PROPOFOL 10 MG/ML IV BOLUS
INTRAVENOUS | Status: DC | PRN
  Administered 2018-04-11 (×2): 30 mg via INTRAVENOUS
  Administered 2018-04-11: 40 mg via INTRAVENOUS
  Administered 2018-04-11: 10 mg via INTRAVENOUS
  Administered 2018-04-11: 30 mg via INTRAVENOUS
  Administered 2018-04-11: 90 mg via INTRAVENOUS

## 2018-04-11 MED ORDER — SODIUM CHLORIDE 0.9 % IV SOLN
INTRAVENOUS | Status: DC
  Administered 2018-04-11: 11:00:00 via INTRAVENOUS

## 2018-04-11 MED ORDER — SODIUM CHLORIDE 0.9 % IV SOLN
INTRAVENOUS | Status: DC
  Administered 2018-04-11: 1000 mL via INTRAVENOUS

## 2018-04-11 NOTE — Anesthesia Preprocedure Evaluation (Addendum)
Anesthesia Evaluation  Patient identified by MRN, date of birth, ID band Patient awake    Reviewed: Allergy & Precautions, H&P , NPO status , Patient's Chart, lab work & pertinent test results  Airway Mallampati: III  TM Distance: >3 FB     Dental  (+) Upper Dentures, Partial Lower   Pulmonary neg pulmonary ROS, former smoker,    breath sounds clear to auscultation       Cardiovascular hypertension, + CAD   Rhythm:regular Rate:Normal  Echo 2018: NORMAL LEFT VENTRICULAR SYSTOLIC FUNCTION WITH MODERATE LVH NORMAL RIGHT VENTRICULAR SYSTOLIC FUNCTION MODERATE VALVULAR REGURGITATION (See above) NO VALVULAR STENOSIS MILD to MODERATE MR EF 55%  NM Stress echo 2018: SPECT images demonstrate moderate perfusion  abnormality of moderate intensity is present in the inferior myocardial  region on the stress images.Resting images show normal myocardial  perfusion without evidence of ischemia or infarct   Neuro/Psych negative neurological ROS  negative psych ROS   GI/Hepatic Neg liver ROS, hiatal hernia, GERD  ,  Endo/Other  negative endocrine ROS  Renal/GU negative Renal ROS  negative genitourinary   Musculoskeletal  (+) Arthritis ,   Abdominal   Peds  Hematology negative hematology ROS (+)   Anesthesia Other Findings Past Medical History: No date: Allergic state No date: Anxiety No date: Arthritis 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 No date: Cancer [facility])     Comment:  skin No date: Chickenpox No date: Coronary artery disease No date: Diverticulosis No date: Gastritis No date: GERD (gastroesophageal reflux disease) No date: Glaucoma No date: Hemorrhoids No date: History of hiatal hernia No date: Hives No date: Hyperlipemia No date: Hypertension  Past Surgical History: No date: BACK SURGERY No date: CARPAL TUNNEL RELEASE No date: COLONOSCOPY 08/27/2015: ESOPHAGOGASTRODUODENOSCOPY (EGD) WITH PROPOFOL; N/A  Comment:  Procedure: ESOPHAGOGASTRODUODENOSCOPY (EGD) WITH               PROPOFOL;  Surgeon: Lollie Sails, MD;  Location:               [facility] ENDOSCOPY;  Service: Endoscopy;  Laterality: N/A; No date: LUMBAR SPINE SURGERY  BMI    Body Mass Index:  29.24 kg/m      Reproductive/Obstetrics negative OB ROS                            Anesthesia Physical Anesthesia Plan  ASA: III  Anesthesia Plan: General   Post-op Pain Management:    Induction:   PONV Risk Score and Plan: Propofol infusion and TIVA  Airway Management Planned:   Additional Equipment:   Intra-op Plan:   Post-operative Plan:   Informed Consent: I have reviewed the patients History and Physical, chart, labs and discussed the procedure including the risks, benefits and alternatives for the proposed anesthesia with the patient or authorized representative who has indicated his/her understanding and acceptance.   Dental Advisory Given  Plan Discussed with: Anesthesiologist, CRNA and Surgeon  Anesthesia Plan Comments:         Anesthesia Quick Evaluation

## 2018-04-11 NOTE — H&P (Signed)
Outpatient short stay form Pre-procedure 04/11/2018 11:05 AM Jason Sails MD  Primary Physician: Dr. [place] Bond  Reason for visit: EGD  History of present illness: Patient is a 77 year old male presenting today as above.  He has personal history of Barrett's esophagus without dysplasia.  He is presenting today for follow-up in this regard.  He does take omeprazole daily and has no complaint of dysphagia or heartburn.  He does have some generalized abdominal discomfort at times.  He does take Celebrex on a regular basis.  He takes 81 mg aspirin regularly but that has been held today and yesterday.  He takes no other aspirin products or blood thinning agent.    Current Facility-Administered Medications:  .  0.9 %  sodium chloride infusion, , Intravenous, Continuous, Jason Sails, MD .  0.9 %  sodium chloride infusion, , Intravenous, Continuous, Jason Sails, MD, Last Rate: 20 mL/hr at 04/11/18 1023, 1,000 mL at 04/11/18 1023  Medications Prior to Admission  Medication Sig Dispense Refill Last Dose  . acetaminophen (TYLENOL) 650 MG CR tablet Take 650 mg by mouth every 8 (eight) hours as needed for pain.   Past Week at Unknown time  . aspirin EC 81 MG tablet Take 81 mg by mouth daily.   04/10/2018 at Unknown time  . azelastine (ASTELIN) 0.1 % nasal spray Place 1 spray into both nostrils 2 (two) times daily. Use in each nostril as directed   04/10/2018 at Unknown time  . brimonidine (ALPHAGAN) 0.2 % ophthalmic solution Place 1 drop into both eyes 3 (three) times daily.   04/10/2018 at Unknown time  . calcium carbonate (TUMS - DOSED IN MG ELEMENTAL CALCIUM) 500 MG chewable tablet Chew 1 tablet by mouth 2 (two) times daily with a meal.   04/10/2018 at Unknown time  . celecoxib (CELEBREX) 200 MG capsule Take 200 mg by mouth 2 (two) times daily.   04/10/2018 at Unknown time  . clobetasol cream (TEMOVATE) 1.06 % Apply 1 application topically 2 (two) times daily.   04/10/2018 at Unknown  time  . latanoprost (XALATAN) 0.005 % ophthalmic solution Place 1 drop into both eyes at bedtime.   04/10/2018 at Unknown time  . levocetirizine (XYZAL) 5 MG tablet Take 5 mg by mouth every evening.   04/10/2018 at prn  . lisinopril-hydrochlorothiazide (PRINZIDE,ZESTORETIC) 10-12.5 MG tablet Take 1 tablet by mouth daily.   04/10/2018 at Unknown time  . montelukast (SINGULAIR) 10 MG tablet Take 10 mg by mouth at bedtime.   04/10/2018 at Unknown time  . OMEGA-3 FATTY ACIDS PO Take 1,000 mg by mouth daily.   Past Week at Unknown time  . omeprazole (PRILOSEC) 40 MG capsule Take 40 mg by mouth daily.   04/10/2018 at Unknown time  . pravastatin (PRAVACHOL) 20 MG tablet Take 20 mg by mouth daily.   04/10/2018 at Unknown time  . sucralfate (CARAFATE) 1 g tablet Take 1 g by mouth 4 (four) times daily -  with meals and at bedtime.   Not Taking at Unknown time     Allergies  Allergen Reactions  . Pravastatin Sodium      Past Medical History:  Diagnosis Date  . Allergic state   . Anxiety   . Arthritis   . Barrett esophagus   . Cancer ([place])    skin  . Chickenpox   . Coronary artery disease   . Diverticulosis   . Gastritis   . GERD (gastroesophageal reflux disease)   . Glaucoma   .  Hemorrhoids   . History of hiatal hernia   . Hives   . Hyperlipemia   . Hypertension     Review of systems:      Physical Exam    Heart and lungs: Regular rate and rhythm without rub or gallop, lungs are bilaterally clear.    HEENT: Normocephalic atraumatic eyes are anicteric    Other:    Pertinant exam for procedure: Soft nontender nondistended bowel sounds positive normoactive.    Planned proceedures: EGD and indicated procedures. I have discussed the risks benefits and complications of procedures to include not limited to bleeding, infection, perforation and the risk of sedation and the patient wishes to proceed.    Jason Sails, MD Gastroenterology 04/11/2018  11:05 AM

## 2018-04-11 NOTE — Anesthesia Post-op Follow-up Note (Signed)
Anesthesia QCDR form completed.        

## 2018-04-11 NOTE — Transfer of Care (Signed)
Immediate Anesthesia Transfer of Care Note  Patient: Jason Bond  Procedure(s) Performed: ESOPHAGOGASTRODUODENOSCOPY (EGD) (N/A )  Patient Location: Endoscopy Unit  Anesthesia Type:General  Level of Consciousness: drowsy and patient cooperative  Airway & Oxygen Therapy: Patient Spontanous Breathing and Patient connected to nasal cannula oxygen  Post-op Assessment: Report given to RN and Post -op Vital signs reviewed and stable  Post vital signs: Reviewed and stable  Last Vitals:  Vitals Value Taken Time  BP 118/67 04/11/2018 11:37 AM  Temp 36.1 C 04/11/2018 11:35 AM  Pulse 80 04/11/2018 11:38 AM  Resp 14 04/11/2018 11:38 AM  SpO2 98 % 04/11/2018 11:38 AM  Vitals shown include unvalidated device data.  Last Pain:  Vitals:   04/11/18 1135  TempSrc: Tympanic  PainSc: 0-No pain         Complications: No apparent anesthesia complications

## 2018-04-11 NOTE — Op Note (Signed)
[facility] Patient Name: Jason Bond Procedure Date: 04/11/2018 11:02 AM MRN: [id number] Account #: [id number] Date of Birth: Oct 31, 1940 Admit Type: Outpatient Age: 77 Room: [facility] ENDO ROOM 4 Gender: Male Note Status: Finalized Procedure:            Upper GI endoscopy Indications:          Follow-up of Barrett's esophagus Providers:            Lollie Sails, MD Referring MD:         Irven Easterly. Kary Kos, MD (Referring MD) Medicines:            Monitored Anesthesia Care Complications:        No immediate complications. Procedure:            Pre-Anesthesia Assessment:                       - ASA Grade Assessment: III - A patient with severe                        systemic disease.                       After obtaining informed consent, the endoscope was                        passed under direct vision. Throughout the procedure,                        the patient's blood pressure, pulse, and oxygen                        saturations were monitored continuously. The Endoscope                        was introduced through the mouth, and advanced to the                        third part of duodenum. The upper GI endoscopy was                        accomplished without difficulty. The patient tolerated                        the procedure well. Findings:      The Z-line was regular and was found 36 cm from the incisors. Mucosa was       biopsied with a cold forceps for histology in 4 quadrants at the       gastroesophageal junction. One specimen bottle was sent to pathology.      A small hiatal hernia was found. The Z-line was a variable distance from       incisors; the hiatal hernia was sliding.      The exam of the esophagus was otherwise normal.      Patchy mild inflammation characterized by erythema was found in the       gastric antrum. Biopsies were taken with a cold forceps for histology.       Biopsies were taken with a cold forceps  for Helicobacter pylori testing.      The examined duodenum was normal.      The cardia and gastric fundus were  normal on retroflexion otherwise. Impression:           - Z-line regular, 36 cm from the incisors. Biopsied.                       - Small hiatal hernia.                       - Gastritis. Biopsied.                       - Normal examined duodenum. Recommendation:       - Discharge patient to home.                       - Await pathology results.                       - Telephone GI clinic for pathology results in 1 week.                       - Continue present medications. Procedure Code(s):    --- Professional ---                       [phone], Esophagogastroduodenoscopy, flexible, transoral;                        with biopsy, single or multiple Diagnosis Code(s):    --- Professional ---                       K22.70, Barrett's esophagus without dysplasia                       K44.9, Diaphragmatic hernia without obstruction or                        gangrene                       K29.70, Gastritis, unspecified, without bleeding CPT copyright 2017 American Medical Association. All rights reserved. The codes documented in this report are preliminary and upon coder review may  be revised to meet current compliance requirements. Lollie Sails, MD 04/11/2018 11:34:04 AM This report has been signed electronically. Number of Addenda: 0 Note Initiated On: 04/11/2018 11:02 AM      [facility]

## 2018-04-13 LAB — SURGICAL PATHOLOGY

## 2018-04-13 NOTE — Anesthesia Postprocedure Evaluation (Signed)
Anesthesia Post Note  Patient: Jason Bond  Procedure(s) Performed: ESOPHAGOGASTRODUODENOSCOPY (EGD) (N/A )  Patient location during evaluation: PACU Anesthesia Type: General Level of consciousness: awake and alert Pain management: pain level controlled Vital Signs Assessment: post-procedure vital signs reviewed and stable Respiratory status: spontaneous breathing, nonlabored ventilation and respiratory function stable Cardiovascular status: blood pressure returned to baseline and stable Postop Assessment: no apparent nausea or vomiting Anesthetic complications: no     Last Vitals:  Vitals:   04/11/18 1135 04/11/18 1208  BP: 118/67 (!) 160/85  Pulse:    Resp: 16   Temp: (!) 36.1 C   SpO2: 97%     Last Pain:  Vitals:   04/11/18 1208  TempSrc:   PainSc: 0-No pain                 Durenda Hurt

## 2020-05-29 ENCOUNTER — Other Ambulatory Visit: Payer: Self-pay

## 2020-05-29 ENCOUNTER — Ambulatory Visit
Admission: EM | Admit: 2020-05-29 | Discharge: 2020-05-29 | Disposition: A | Discharge status: Home / Self Care | Payer: Medicare HMO | Attending: Emergency Medicine | Admitting: Emergency Medicine

## 2020-05-29 DIAGNOSIS — K649 Unspecified hemorrhoids: Secondary | ICD-10-CM

## 2020-05-29 NOTE — ED Triage Notes (Signed)
Pt presents with hemorrhoid and blood in feces today.

## 2020-05-29 NOTE — ED Provider Notes (Addendum)
HPI  SUBJECTIVE:  Jason Bond is a 79 y.o. adult who presents with rectal bleeding after having a bowel movement one hour ago.  States that he feels as if one of his intestines is "hanging out".  Unsure if it has "gone back in.".  He states it feels like a "round ball".  He states it feels raw, with burning pain and is itching.  He describes rectal bleeding as dripping into the toilet, and he noticed it on the toilet paper after wiping.  He denies melena, hematochezia.  States that he was straining hard to have a bowel movement when the symptoms occurred.  No vomiting, fevers, abdominal, pelvic pain.  He has never had symptoms like this before.  He tried stuffing a towel in his underwear but continued bleeding.  Symptoms are worse with sitting.  He has a past medical history of hypertension, GERD, coronary artery disease.  He is on low-dose aspirin 81 mg daily.  No other anticoagulants or antiplatelets.  No history of LGI bleed or UGI bleed.  [id number], [name], MD    Past Medical History:  Diagnosis Date  . Allergic state   . Anxiety   . Arthritis   . Barrett esophagus   . Cancer ([place])    skin  . Chickenpox   . Coronary artery disease   . Diverticulosis   . Gastritis   . GERD (gastroesophageal reflux disease)   . Glaucoma   . Hemorrhoids   . History of hiatal hernia   . Hives   . Hyperlipemia   . Hypertension     Past Surgical History:  Procedure Laterality Date  . BACK SURGERY    . CARPAL TUNNEL RELEASE    . COLONOSCOPY    . ESOPHAGOGASTRODUODENOSCOPY N/A 04/11/2018   Procedure: ESOPHAGOGASTRODUODENOSCOPY (EGD);  Surgeon: Lollie Sails, MD;  Location: [facility] ENDOSCOPY;  Service: Endoscopy;  Laterality: N/A;  . ESOPHAGOGASTRODUODENOSCOPY (EGD) WITH PROPOFOL N/A 08/27/2015   Procedure: ESOPHAGOGASTRODUODENOSCOPY (EGD) WITH PROPOFOL;  Surgeon: Lollie Sails, MD;  Location: [facility] ENDOSCOPY;  Service: Endoscopy;  Laterality: N/A;  . LUMBAR SPINE SURGERY      Family  History  Family history unknown: Yes    Social History   Tobacco Use  . Smoking status: Former Research scientist (life sciences)  . Smokeless tobacco: Never Used  Vaping Use  . Vaping Use: Never used  Substance Use Topics  . Alcohol use: No  . Drug use: No    No current facility-administered medications for this encounter.  Current Outpatient Medications:  .  acetaminophen (TYLENOL) 650 MG CR tablet, Take 650 mg by mouth every 8 (eight) hours as needed for pain., Disp: , Rfl:  .  aspirin EC 81 MG tablet, Take 81 mg by mouth daily., Disp: , Rfl:  .  azelastine (ASTELIN) 0.1 % nasal spray, Place 1 spray into both nostrils 2 (two) times daily. Use in each nostril as directed, Disp: , Rfl:  .  brimonidine (ALPHAGAN) 0.2 % ophthalmic solution, Place 1 drop into both eyes 3 (three) times daily., Disp: , Rfl:  .  calcium carbonate (TUMS - DOSED IN MG ELEMENTAL CALCIUM) 500 MG chewable tablet, Chew 1 tablet by mouth 2 (two) times daily with a meal., Disp: , Rfl:  .  celecoxib (CELEBREX) 200 MG capsule, Take 200 mg by mouth 2 (two) times daily., Disp: , Rfl:  .  clobetasol cream (TEMOVATE) 8.46 %, Apply 1 application topically 2 (two) times daily., Disp: , Rfl:  .  latanoprost (XALATAN) 0.005 %  ophthalmic solution, Place 1 drop into both eyes at bedtime., Disp: , Rfl:  .  levocetirizine (XYZAL) 5 MG tablet, Take 5 mg by mouth every evening., Disp: , Rfl:  .  lisinopril-hydrochlorothiazide (PRINZIDE,ZESTORETIC) 10-12.5 MG tablet, Take 1 tablet by mouth daily., Disp: , Rfl:  .  montelukast (SINGULAIR) 10 MG tablet, Take 10 mg by mouth at bedtime., Disp: , Rfl:  .  OMEGA-3 FATTY ACIDS PO, Take 1,000 mg by mouth daily., Disp: , Rfl:  .  omeprazole (PRILOSEC) 40 MG capsule, Take 40 mg by mouth daily., Disp: , Rfl:  .  pravastatin (PRAVACHOL) 20 MG tablet, Take 20 mg by mouth daily., Disp: , Rfl:  .  sucralfate (CARAFATE) 1 g tablet, Take 1 g by mouth 4 (four) times daily -  with meals and at bedtime., Disp: , Rfl:    Allergies  Allergen Reactions  . Pravastatin Sodium      ROS  As noted in HPI.   Physical Exam  BP (!) 152/82   Pulse 73   Temp 98.5 F (36.9 C) (Oral)   Resp 17   SpO2 99%   Constitutional: Well developed, well nourished, no acute distress Eyes:  EOMI, conjunctiva normal bilaterally HENT: Normocephalic, atraumatic,mucus membranes moist Respiratory: Normal inspiratory effort Cardiovascular: Normal rate GI: nondistended soft, nontender, nondistended. Rectal: 2 large thrombosed tender perirectal masses which appear to be external hemorrhoids, 1 of which is friable and oozing blood.  Does not change in size with Valsalva.  Patient declined chaperone. skin: No rash, skin intact Musculoskeletal: no deformities Neurologic: Alert & oriented x 3, no focal neuro deficits Psychiatric: Speech and behavior appropriate   ED Course   Medications - No data to display  No orders of the defined types were placed in this encounter.   No results found for this or any previous visit (from the past 24 hour(s)). No results found.  ED Clinical Impression  1. Hemorrhoids, unspecified hemorrhoid type      ED Assessment/Plan  It does not appear to be rectal prolapse as it does not change with Valsalva.  Appears to be 2 large thrombosed hemorrhoids.  Placed gauze.  Discussed with Dr. Lysle Pearl, general surgery on-call.  He states that he will see the patient in the office right now.  Sending the patient immediately to the office.  Advised patient did not have anything to eat or drink until evaluation was complete.  Patient agrees to go to the surgeon's office right now.  Initial blood pressure noted.  Repeat blood pressure 152/82  Discussed MDM, treatment plan, and plan for follow-up with patient.  patient agrees with plan.   No orders of the defined types were placed in this encounter.   *This clinic note was created using Dragon dictation software. Therefore, there may be occasional  mistakes despite careful proofreading.   ?    Melynda Ripple, MD 05/29/20 1336    Melynda Ripple, MD 05/29/20 [phone]

## 2020-05-29 NOTE — Discharge Instructions (Addendum)
Go see Dr. Lysle Pearl at [address], [place] [phone] right now.  He is expecting you.  The office is only open until 230.  Do not have anything to eat or drink until your evaluation is complete in case it needs to be drained in the operating room.

## 2021-06-11 ENCOUNTER — Other Ambulatory Visit: Payer: Self-pay | Admitting: Neurology

## 2021-06-11 ENCOUNTER — Other Ambulatory Visit (HOSPITAL_COMMUNITY): Payer: Self-pay | Admitting: Neurology

## 2021-06-11 DIAGNOSIS — R413 Other amnesia: Secondary | ICD-10-CM

## 2021-06-21 ENCOUNTER — Ambulatory Visit: Payer: Medicare HMO

## 2021-06-23 ENCOUNTER — Encounter (HOSPITAL_COMMUNITY): Payer: Self-pay

## 2021-06-23 ENCOUNTER — Ambulatory Visit (HOSPITAL_COMMUNITY): Payer: Medicare Other

## 2022-01-12 ENCOUNTER — Other Ambulatory Visit: Payer: Self-pay | Admitting: Physical Medicine and Rehabilitation

## 2022-01-12 DIAGNOSIS — M5416 Radiculopathy, lumbar region: Secondary | ICD-10-CM

## 2022-01-21 ENCOUNTER — Ambulatory Visit
Admission: RE | Admit: 2022-01-21 | Discharge: 2022-01-21 | Disposition: A | Discharge status: Home / Self Care | Payer: Medicare Other | Source: Ambulatory Visit | Attending: Physical Medicine and Rehabilitation | Admitting: Physical Medicine and Rehabilitation

## 2022-01-21 DIAGNOSIS — M5416 Radiculopathy, lumbar region: Secondary | ICD-10-CM

## 2022-03-24 ENCOUNTER — Encounter: Payer: Self-pay | Admitting: Ophthalmology

## 2022-03-25 NOTE — Discharge Instructions (Signed)

## 2022-03-27 NOTE — Anesthesia Preprocedure Evaluation (Signed)
Anesthesia Evaluation  Patient identified by MRN, date of birth, ID band Patient awake    Reviewed: Allergy & Precautions, NPO status , Patient's Chart, lab work & pertinent test results  Airway Mallampati: III  TM Distance: >3 FB Neck ROM: full    Dental  (+) Chipped, Upper Dentures, Partial Lower   Pulmonary former smoker,    Pulmonary exam normal        Cardiovascular hypertension, + CAD  Normal cardiovascular exam  Bilateral carotid artery stenosis  Cardiology visit 02/2022   Neuro/Psych PSYCHIATRIC DISORDERS Anxiety Dementia negative neurological ROS     GI/Hepatic Neg liver ROS, hiatal hernia, GERD  Controlled,  Endo/Other  negative endocrine ROS  Renal/GU negative Renal ROS  negative genitourinary   Musculoskeletal   Abdominal Normal abdominal exam  (+)   Peds  Hematology negative hematology ROS (+)   Anesthesia Other Findings Past Medical History: No date: Allergic state No date: Anxiety No date: Arthritis No date: Asthma     Comment:  mild 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 No date: Cancer ([place])     Comment:  skin No date: Chickenpox No date: Coronary artery disease No date: Diverticulosis No date: Gastritis No date: GERD (gastroesophageal reflux disease) No date: Glaucoma No date: Hemorrhoids No date: History of hiatal hernia No date: Hives No date: HOH (hard of hearing) No date: Hyperlipemia No date: Hypertension No date: Wears dentures     Comment:  upper and lower  Past Surgical History: No date: BACK SURGERY No date: CARPAL TUNNEL RELEASE No date: COLONOSCOPY 04/11/2018: ESOPHAGOGASTRODUODENOSCOPY; N/A     Comment:  Procedure: ESOPHAGOGASTRODUODENOSCOPY (EGD);  Surgeon:               Lollie Sails, MD;  Location[facility] ENDOSCOPY;                Service: Endoscopy;  Laterality: N/A; 08/27/2015: ESOPHAGOGASTRODUODENOSCOPY (EGD) WITH PROPOFOL; N/A     Comment:  Procedure:  ESOPHAGOGASTRODUODENOSCOPY (EGD) WITH               PROPOFOL;  Surgeon: Lollie Sails, MD;  Location:               [facility] ENDOSCOPY;  Service: Endoscopy;  Laterality: N/A; No date: LUMBAR SPINE SURGERY  BMI    Body Mass Index: 24.89 kg/m      Reproductive/Obstetrics negative OB ROS                           Anesthesia Physical Anesthesia Plan  ASA: 2  Anesthesia Plan: MAC   Post-op Pain Management: Minimal or no pain anticipated   Induction:   PONV Risk Score and Plan: Midazolam  Airway Management Planned: Natural Airway  Additional Equipment:   Intra-op Plan:   Post-operative Plan:   Informed Consent: I have reviewed the patients History and Physical, chart, labs and discussed the procedure including the risks, benefits and alternatives for the proposed anesthesia with the patient or authorized representative who has indicated his/her understanding and acceptance.     Dental Advisory Given  Plan Discussed with: Anesthesiologist, CRNA and Surgeon  Anesthesia Plan Comments:        Anesthesia Quick Evaluation

## 2022-03-30 ENCOUNTER — Ambulatory Visit
Admission: RE | Admit: 2022-03-30 | Discharge: 2022-03-30 | Disposition: A | Discharge status: Home / Self Care | Payer: Medicare Other | Source: Ambulatory Visit | Attending: Ophthalmology | Admitting: Ophthalmology

## 2022-03-30 ENCOUNTER — Ambulatory Visit: Payer: Medicare Other | Admitting: Anesthesiology

## 2022-03-30 ENCOUNTER — Ambulatory Visit (AMBULATORY_SURGERY_CENTER): Payer: Medicare Other | Admitting: Anesthesiology

## 2022-03-30 ENCOUNTER — Encounter: Payer: Self-pay | Admitting: Ophthalmology

## 2022-03-30 ENCOUNTER — Encounter
Admission: RE | Disposition: A | Discharge status: Home / Self Care | Payer: Self-pay | Source: Ambulatory Visit | Attending: Ophthalmology

## 2022-03-30 ENCOUNTER — Other Ambulatory Visit: Payer: Self-pay

## 2022-03-30 DIAGNOSIS — H401121 Primary open-angle glaucoma, left eye, mild stage: Secondary | ICD-10-CM

## 2022-03-30 DIAGNOSIS — H2512 Age-related nuclear cataract, left eye: Secondary | ICD-10-CM

## 2022-03-30 DIAGNOSIS — F0390 Unspecified dementia without behavioral disturbance: Secondary | ICD-10-CM | POA: Diagnosis not present

## 2022-03-30 DIAGNOSIS — I2510 Atherosclerotic heart disease of native coronary artery without angina pectoris: Secondary | ICD-10-CM | POA: Insufficient documentation

## 2022-03-30 DIAGNOSIS — Z87891 Personal history of nicotine dependence: Secondary | ICD-10-CM | POA: Insufficient documentation

## 2022-03-30 DIAGNOSIS — I10 Essential (primary) hypertension: Secondary | ICD-10-CM

## 2022-03-30 DIAGNOSIS — J45909 Unspecified asthma, uncomplicated: Secondary | ICD-10-CM | POA: Insufficient documentation

## 2022-03-30 DIAGNOSIS — F419 Anxiety disorder, unspecified: Secondary | ICD-10-CM | POA: Insufficient documentation

## 2022-03-30 DIAGNOSIS — K219 Gastro-esophageal reflux disease without esophagitis: Secondary | ICD-10-CM | POA: Insufficient documentation

## 2022-03-30 HISTORY — DX: Unspecified asthma, uncomplicated: J45.909

## 2022-03-30 HISTORY — DX: Presence of dental prosthetic device (complete) (partial): Z97.2

## 2022-03-30 HISTORY — PX: CATARACT EXTRACTION W/PHACO: SHX586

## 2022-03-30 HISTORY — DX: Unspecified hearing loss, unspecified ear: H91.90

## 2022-03-30 SURGERY — PHACOEMULSIFICATION, CATARACT, WITH IOL INSERTION
Anesthesia: Monitor Anesthesia Care | Site: Eye | Laterality: Left

## 2022-03-30 MED ORDER — SIGHTPATH DOSE#1 BSS IO SOLN
INTRAOCULAR | Status: DC | PRN
  Administered 2022-03-30: 103 mL via OPHTHALMIC

## 2022-03-30 MED ORDER — LIDOCAINE HCL (PF) 2 % IJ SOLN
INTRAOCULAR | Status: DC | PRN
  Administered 2022-03-30: 1 mL via INTRAOCULAR

## 2022-03-30 MED ORDER — TETRACAINE HCL 0.5 % OP SOLN
1.0000 [drp] | OPHTHALMIC | Status: DC | PRN
  Administered 2022-03-30 (×3): 1 [drp] via OPHTHALMIC

## 2022-03-30 MED ORDER — SIGHTPATH DOSE#1 NA HYALUR & NA CHOND-NA HYALUR IO KIT
PACK | INTRAOCULAR | Status: DC | PRN
  Administered 2022-03-30: 1 via OPHTHALMIC

## 2022-03-30 MED ORDER — SIGHTPATH DOSE#1 BSS IO SOLN
INTRAOCULAR | Status: DC | PRN
  Administered 2022-03-30: 15 mL

## 2022-03-30 MED ORDER — MOXIFLOXACIN HCL 0.5 % OP SOLN
OPHTHALMIC | Status: DC | PRN
  Administered 2022-03-30: 0.2 mL via OPHTHALMIC

## 2022-03-30 MED ORDER — MIDAZOLAM HCL 2 MG/2ML IJ SOLN
INTRAMUSCULAR | Status: DC | PRN
  Administered 2022-03-30: 1 mg via INTRAVENOUS

## 2022-03-30 MED ORDER — ARMC OPHTHALMIC DILATING DROPS
1.0000 | OPHTHALMIC | Status: DC | PRN
  Administered 2022-03-30 (×3): 1 via OPHTHALMIC

## 2022-03-30 MED ORDER — NA CHONDROIT SULF-NA HYALURON 40-30 MG/ML IO SOSY
INTRAOCULAR | Status: DC | PRN
  Administered 2022-03-30: 0.5 mL via INTRAOCULAR

## 2022-03-30 MED ORDER — FENTANYL CITRATE (PF) 100 MCG/2ML IJ SOLN
INTRAMUSCULAR | Status: DC | PRN
  Administered 2022-03-30 (×2): 50 ug via INTRAVENOUS

## 2022-03-30 SURGICAL SUPPLY — 14 items
CATARACT SUITE SIGHTPATH (MISCELLANEOUS) ×1 IMPLANT
DISSECTOR HYDRO NUCLEUS 50X22 (MISCELLANEOUS) ×1 IMPLANT
FEE CATARACT SUITE SIGHTPATH (MISCELLANEOUS) ×1 IMPLANT
GLOVE SURG GAMMEX PI TX LF 7.5 (GLOVE) ×1 IMPLANT
GLOVE SURG SYN 8.5  E (GLOVE) ×1
GLOVE SURG SYN 8.5 E (GLOVE) ×1 IMPLANT
GLOVE SURG SYN 8.5 PF PI (GLOVE) ×1 IMPLANT
LENS IOL TECNIS EYHANCE 20.0 (Intraocular Lens) IMPLANT
NDL FILTER BLUNT 18X1 1/2 (NEEDLE) ×1 IMPLANT
NEEDLE FILTER BLUNT 18X1 1/2 (NEEDLE) ×1 IMPLANT
STENT OPTH STRL GLAUCOMA IMPLANT
SYR 3ML LL SCALE MARK (SYRINGE) ×1 IMPLANT
SYR 5ML LL (SYRINGE) ×1 IMPLANT
WATER STERILE IRR 250ML POUR (IV SOLUTION) ×1 IMPLANT

## 2022-03-30 NOTE — H&P (Signed)
[facility]   Primary Care Physician:  [place] Pink, MD Ophthalmologist: Dr. Benay Pillow  Pre-Procedure History & Physical: HPI:  Jason Bond is a 81 y.o. male here for cataract surgery.   Past Medical History:  Diagnosis Date   Allergic state    Anxiety    Arthritis    Asthma    mild   Barrett esophagus    Cancer ([place])    skin   Chickenpox    Coronary artery disease    Diverticulosis    Gastritis    GERD (gastroesophageal reflux disease)    Glaucoma    Hemorrhoids    History of hiatal hernia    Hives    HOH (hard of hearing)    Hyperlipemia    Hypertension    Wears dentures    upper and lower    Past Surgical History:  Procedure Laterality Date   BACK SURGERY     CARPAL TUNNEL RELEASE     COLONOSCOPY     ESOPHAGOGASTRODUODENOSCOPY N/A 04/11/2018   Procedure: ESOPHAGOGASTRODUODENOSCOPY (EGD);  Surgeon: Lollie Sails, MD;  Location: [facility] ENDOSCOPY;  Service: Endoscopy;  Laterality: N/A;   ESOPHAGOGASTRODUODENOSCOPY (EGD) WITH PROPOFOL N/A 08/27/2015   Procedure: ESOPHAGOGASTRODUODENOSCOPY (EGD) WITH PROPOFOL;  Surgeon: Lollie Sails, MD;  Location: [facility] ENDOSCOPY;  Service: Endoscopy;  Laterality: N/A;   LUMBAR SPINE SURGERY      Prior to Admission medications   Medication Sig Start Date End Date Taking? Authorizing Provider  acetaminophen (TYLENOL) 650 MG CR tablet Take 650 mg by mouth every 8 (eight) hours as needed for pain.   Yes [provider]  albuterol (VENTOLIN HFA) 108 (90 Base) MCG/ACT inhaler Inhale into the lungs every 6 (six) hours as needed for wheezing or shortness of breath.   Yes [provider]  Alpha-Lipoic Acid 300 MG TABS Take by mouth 2 (two) times daily.   Yes [provider]  aspirin EC 81 MG tablet Take 81 mg by mouth daily.   Yes [provider]  atorvastatin (LIPITOR) 40 MG tablet Take 40 mg by mouth daily.   Yes [provider]  azelastine (ASTELIN) 0.1 % nasal  spray Place 1 spray into both nostrils 2 (two) times daily. Use in each nostril as directed   Yes [provider]  brimonidine-timolol (COMBIGAN) 0.2-0.5 % ophthalmic solution Place 1 drop into both eyes every 12 (twelve) hours.   Yes [provider]  celecoxib (CELEBREX) 200 MG capsule Take 200 mg by mouth 2 (two) times daily.   Yes [provider]  Cholecalciferol (VITAMIN D3) 50 MCG (2000 UT) TABS Take by mouth in the morning and at bedtime.   Yes [provider]  clobetasol cream (TEMOVATE) 7.42 % Apply 1 application topically 2 (two) times daily.   Yes [provider]  donepezil (ARICEPT) 5 MG tablet Take 2.5 mg by mouth daily with supper.   Yes [provider]  gabapentin (NEURONTIN) 100 MG capsule Take 200 mg by mouth 3 (three) times daily.   Yes [provider]  hydrOXYzine (ATARAX) 25 MG tablet Take 25 mg by mouth 3 (three) times daily as needed.   Yes [provider]  latanoprost (XALATAN) 0.005 % ophthalmic solution Place 1 drop into both eyes at bedtime.   Yes [provider]  levocetirizine (XYZAL) 5 MG tablet Take 5 mg by mouth every evening.   Yes [provider]  lisinopril (ZESTRIL) 10 MG tablet Take 10 mg by mouth  daily.   Yes [provider]  montelukast (SINGULAIR) 10 MG tablet Take 10 mg by mouth at bedtime.   Yes [provider]  Multiple Vitamins-Minerals (ADVANCED EYE HEALTH PO) Take by mouth 2 (two) times daily.   Yes [provider]  omeprazole (PRILOSEC) 40 MG capsule Take 40 mg by mouth in the morning and at bedtime.   Yes [provider]  vitamin B-12 (CYANOCOBALAMIN) 500 MCG tablet Take 500 mcg by mouth daily.   Yes [provider]  calcium carbonate (TUMS - DOSED IN MG ELEMENTAL CALCIUM) 500 MG chewable tablet Chew 1 tablet by mouth 2 (two) times daily with a meal. Patient not taking: Reported on 03/24/2022    [provider]   OMEGA-3 FATTY ACIDS PO Take 1,000 mg by mouth daily. Patient not taking: Reported on 03/24/2022    [provider]    Allergies as of 02/18/2022 - Review Complete 05/29/2020  Allergen Reaction Noted   Pravastatin sodium  07/12/2015    Family History  Family history unknown: Yes    Social History   Socioeconomic History   Marital status: Divorced    Spouse name: Not on file   Number of children: Not on file   Years of education: Not on file   Highest education level: Not on file  Occupational History   Not on file  Tobacco Use   Smoking status: Former    Types: Cigarettes    Quit date: 2000    Years since quitting: 23.7   Smokeless tobacco: Never  Vaping Use   Vaping Use: Never used  Substance and Sexual Activity   Alcohol use: No   Drug use: No   Sexual activity: Not on file  Other Topics Concern   Not on file  Social History Narrative   Not on file   Social Determinants of Health   Financial Resource Strain: Not on file  Food Insecurity: Not on file  Transportation Needs: Not on file  Physical Activity: Not on file  Stress: Not on file  Social Connections: Not on file  Intimate Partner Violence: Not on file    Review of Systems: See HPI, otherwise negative ROS  Physical Exam: BP (!) 176/82   Pulse 66   Temp 98.4 F (36.9 C) (Temporal)   Resp 18   Ht '5\' 4"'$  (1.626 m)   Wt 68.5 kg   SpO2 98%   BMI 25.92 kg/m  General:   Alert, cooperative in NAD Head:  Normocephalic and atraumatic. Respiratory:  Normal work of breathing. Cardiovascular:  RRR  Impression/Plan: Jason Bond is here for cataract surgery.  Risks, benefits, limitations, and alternatives regarding cataract surgery have been reviewed with the patient.  Questions have been answered.  All parties agreeable.   Benay Pillow, MD  03/30/2022, 7:45 AM

## 2022-03-30 NOTE — Anesthesia Postprocedure Evaluation (Signed)
Anesthesia Post Note  Patient: Jason Bond  Procedure(s) Performed: CATARACT EXTRACTION PHACO AND INTRAOCULAR LENS PLACEMENT (IOC)LEFT HYDRUS MICROSTENT DIABETIC 4.27 00:33.2 (Left: Eye)     Patient location during evaluation: PACU Anesthesia Type: MAC Level of consciousness: awake and alert Pain management: pain level controlled Vital Signs Assessment: post-procedure vital signs reviewed and stable Respiratory status: spontaneous breathing, nonlabored ventilation and respiratory function stable Cardiovascular status: blood pressure returned to baseline and stable Postop Assessment: no apparent nausea or vomiting Anesthetic complications: no   No notable events documented.  Iran Ouch

## 2022-03-30 NOTE — Transfer of Care (Signed)
Immediate Anesthesia Transfer of Care Note  Patient: Jason Bond  Procedure(s) Performed: CATARACT EXTRACTION PHACO AND INTRAOCULAR LENS PLACEMENT (IOC)LEFT HYDRUS MICROSTENT DIABETIC 4.27 00:33.2 (Left: Eye)  Patient Location: PACU  Anesthesia Type: MAC  Level of Consciousness: awake, alert  and patient cooperative  Airway and Oxygen Therapy: Patient Spontanous Breathing and Patient connected to supplemental oxygen  Post-op Assessment: Post-op Vital signs reviewed, Patient's Cardiovascular Status Stable, Respiratory Function Stable, Patent Airway and No signs of Nausea or vomiting  Post-op Vital Signs: Reviewed and stable  Complications: No notable events documented.

## 2022-03-30 NOTE — Op Note (Signed)
OPERATIVE NOTE  Jason Bond [id number] 03/30/2022  PREOPERATIVE DIAGNOSIS:   1.  Mild  PRIMARY open angle glaucoma, left eye. V78.4696  2.  Nuclear sclerotic cataract left eye.  H25.12   POSTOPERATIVE DIAGNOSIS:    same.   PROCEDURE:   1.  Placement of trabecular bypass stent (hydrus) and phacoemusification with posterior chamber intraocular lens placement of the left eye  CPT [phone]   LENS: Implant Name Type Inv. Item Serial No. Manufacturer Lot No. LRB No. Used Action  STENT OPTH STRL GLAUCOMA - [id number]  STENT OPTH STRL GLAUCOMA  [employer] [id number] Left 1 Implanted  LENS IOL TECNIS EYHANCE 20.0 - [id number] Intraocular Lens LENS IOL TECNIS EYHANCE 20.0 [id number] [employer]  Left 1 Implanted      Procedure(s): CATARACT EXTRACTION PHACO AND INTRAOCULAR LENS PLACEMENT (IOC)LEFT HYDRUS MICROSTENT DIABETIC 4.27 00:33.2 (Left)  DIB00 +20.0  ULTRASOUND TIME: 0 minutes 33 seconds.  CDE 4.27   SURGEON:  Benay Pillow, MD, MPH  ANESTHESIOLOGIST: Anesthesiologist: [place] Ouch, MD CRNA: Louanne Belton, CRNA   ANESTHESIA:  MAC  and intracameral preservative-free intracameral lidocaine 4%.  ESTIMATED BLOOD LOSS: less than 1 mL.   COMPLICATIONS:  None.   DESCRIPTION OF PROCEDURE:  The patient was identified in the holding room and transported to the operating room.  The patient was placed in the supine position under the operating microscope.  The left eye was prepped and draped in the usual sterile ophthalmic fashion.   A 1.0 millimeter clear-corneal paracentesis was made at the 4:30 position, and a second paracentesis was made at 1:30 for the hydrus. 0.5 ml of preservative-free 1% lidocaine with epinephrine was injected into the anterior chamber.  The anterior chamber was filled with Healon 5 viscoelastic.  A 2.4 millimeter keratome was used to make a near-clear corneal incision at the 2:00 position.   Attention was turned to the hydrus stent.  The  patients head was turned to the left and the microscope was tilted to 035 degrees.  Ocular instruments/Glaukos OAL/H2 gonioprism was used with Healon 5 on the cornea to visualize the trabecular meshwork. The hydrus was introduced into the eye and the  meshwork was engaged with the tip of the and the stent was deployed into Schlemm's canal.  The stent was well seated and in good position.  Next, attention was turned to the phacoemulsification A curvilinear capsulorrhexis was made with a cystotome and capsulorrhexis forceps.  Balanced salt solution was used to hydrodissect and hydrodelineate the nucleus.   Phacoemulsification was then used in stop and chop fashion to remove the lens nucleus and epinucleus.  The remaining cortex was then removed using the irrigation and aspiration handpiece. Healon was then placed into the capsular bag to distend it for lens placement.  A lens was then injected into the capsular bag.  The remaining viscoelastic was aspirated.   Wounds were hydrated with balanced salt solution.  The anterior chamber was inflated to a physiologic pressure with balanced salt solution.   Intracameral vigamox 0.1 mL undiluted was injected into the eye and a drop placed onto the ocular surface.  No wound leaks were noted.  Protective glasses were placed on the patient.  The patient was taken to the recovery room in stable condition without complications of anesthesia or surgery   Benay Pillow 03/30/2022, 8:21 AM

## 2022-04-01 ENCOUNTER — Encounter: Payer: Self-pay | Admitting: Ophthalmology

## 2022-04-06 ENCOUNTER — Encounter: Payer: Self-pay | Admitting: Ophthalmology

## 2022-04-10 NOTE — Discharge Instructions (Signed)

## 2022-04-13 ENCOUNTER — Other Ambulatory Visit: Payer: Self-pay

## 2022-04-13 ENCOUNTER — Encounter: Payer: Self-pay | Admitting: Ophthalmology

## 2022-04-13 ENCOUNTER — Ambulatory Visit: Payer: Medicare Other | Admitting: Anesthesiology

## 2022-04-13 ENCOUNTER — Encounter
Admission: RE | Disposition: A | Discharge status: Home / Self Care | Payer: Self-pay | Source: Ambulatory Visit | Attending: Ophthalmology

## 2022-04-13 ENCOUNTER — Ambulatory Visit
Admission: RE | Admit: 2022-04-13 | Discharge: 2022-04-13 | Disposition: A | Discharge status: Home / Self Care | Payer: Medicare Other | Source: Ambulatory Visit | Attending: Ophthalmology | Admitting: Ophthalmology

## 2022-04-13 DIAGNOSIS — H401111 Primary open-angle glaucoma, right eye, mild stage: Secondary | ICD-10-CM | POA: Insufficient documentation

## 2022-04-13 DIAGNOSIS — I2510 Atherosclerotic heart disease of native coronary artery without angina pectoris: Secondary | ICD-10-CM | POA: Insufficient documentation

## 2022-04-13 DIAGNOSIS — H2511 Age-related nuclear cataract, right eye: Secondary | ICD-10-CM | POA: Insufficient documentation

## 2022-04-13 DIAGNOSIS — Z87891 Personal history of nicotine dependence: Secondary | ICD-10-CM | POA: Diagnosis not present

## 2022-04-13 DIAGNOSIS — J45909 Unspecified asthma, uncomplicated: Secondary | ICD-10-CM | POA: Insufficient documentation

## 2022-04-13 DIAGNOSIS — I10 Essential (primary) hypertension: Secondary | ICD-10-CM | POA: Diagnosis not present

## 2022-04-13 DIAGNOSIS — K219 Gastro-esophageal reflux disease without esophagitis: Secondary | ICD-10-CM | POA: Diagnosis not present

## 2022-04-13 HISTORY — PX: CATARACT EXTRACTION W/PHACO: SHX586

## 2022-04-13 SURGERY — PHACOEMULSIFICATION, CATARACT, WITH IOL INSERTION
Anesthesia: Monitor Anesthesia Care | Site: Eye | Laterality: Right

## 2022-04-13 MED ORDER — SIGHTPATH DOSE#1 NA HYALUR & NA CHOND-NA HYALUR IO KIT
PACK | INTRAOCULAR | Status: DC | PRN
  Administered 2022-04-13: 1 via OPHTHALMIC

## 2022-04-13 MED ORDER — ARMC OPHTHALMIC DILATING DROPS
1.0000 | OPHTHALMIC | Status: DC | PRN
  Administered 2022-04-13 (×3): 1 via OPHTHALMIC

## 2022-04-13 MED ORDER — LIDOCAINE HCL (PF) 2 % IJ SOLN
INTRAOCULAR | Status: DC | PRN
  Administered 2022-04-13: 1 mL via INTRAOCULAR

## 2022-04-13 MED ORDER — MOXIFLOXACIN HCL 0.5 % OP SOLN
OPHTHALMIC | Status: DC | PRN
  Administered 2022-04-13: 0.2 mL via OPHTHALMIC

## 2022-04-13 MED ORDER — MIDAZOLAM HCL 2 MG/2ML IJ SOLN
INTRAMUSCULAR | Status: DC | PRN
  Administered 2022-04-13: 1 mg via INTRAVENOUS

## 2022-04-13 MED ORDER — FENTANYL CITRATE (PF) 100 MCG/2ML IJ SOLN
INTRAMUSCULAR | Status: DC | PRN
  Administered 2022-04-13 (×2): 50 ug via INTRAVENOUS

## 2022-04-13 MED ORDER — SIGHTPATH DOSE#1 BSS IO SOLN
INTRAOCULAR | Status: DC | PRN
  Administered 2022-04-13: 75 mL via OPHTHALMIC

## 2022-04-13 MED ORDER — SIGHTPATH DOSE#1 BSS IO SOLN
INTRAOCULAR | Status: DC | PRN
  Administered 2022-04-13: 15 mL

## 2022-04-13 MED ORDER — TETRACAINE HCL 0.5 % OP SOLN
1.0000 [drp] | OPHTHALMIC | Status: DC | PRN
  Administered 2022-04-13 (×2): 1 [drp] via OPHTHALMIC

## 2022-04-13 SURGICAL SUPPLY — 15 items
CATARACT SUITE SIGHTPATH (MISCELLANEOUS) ×1 IMPLANT
DISSECTOR HYDRO NUCLEUS 50X22 (MISCELLANEOUS) ×1 IMPLANT
FEE CATARACT SUITE SIGHTPATH (MISCELLANEOUS) ×1 IMPLANT
GLOVE SURG GAMMEX PI TX LF 7.5 (GLOVE) ×1 IMPLANT
GLOVE SURG SYN 8.5  E (GLOVE) ×1
GLOVE SURG SYN 8.5 E (GLOVE) ×1 IMPLANT
GLOVE SURG SYN 8.5 PF PI (GLOVE) ×1 IMPLANT
IVANTIS HYDRUS MICROSTENT (Stent) ×1 IMPLANT
LENS IOL TECNIS EYHANCE 20.0 (Intraocular Lens) IMPLANT
MICROSTENT IVANTIS HYDRUS (Stent) IMPLANT
NDL FILTER BLUNT 18X1 1/2 (NEEDLE) ×1 IMPLANT
NEEDLE FILTER BLUNT 18X1 1/2 (NEEDLE) ×1 IMPLANT
SYR 3ML LL SCALE MARK (SYRINGE) ×1 IMPLANT
SYR 5ML LL (SYRINGE) ×1 IMPLANT
WATER STERILE IRR 250ML POUR (IV SOLUTION) ×1 IMPLANT

## 2022-04-13 NOTE — Anesthesia Preprocedure Evaluation (Signed)
Anesthesia Evaluation  Patient identified by MRN, date of birth, ID band Patient awake    Reviewed: Allergy & Precautions, NPO status , Patient's Chart, lab work & pertinent test results  History of Anesthesia Complications Negative for: history of anesthetic complications  Airway Mallampati: III  TM Distance: >3 FB Neck ROM: full    Dental  (+) Upper Dentures, Partial Lower, Chipped   Pulmonary asthma , former smoker,    Pulmonary exam normal        Cardiovascular hypertension, Pt. on medications + CAD  Normal cardiovascular exam  Bilateral carotid artery stenosis  Cardiology visit 02/2022   Neuro/Psych PSYCHIATRIC DISORDERS Anxiety Dementia negative neurological ROS     GI/Hepatic Neg liver ROS, hiatal hernia, GERD  Controlled,  Endo/Other  negative endocrine ROS  Renal/GU negative Renal ROS  negative genitourinary   Musculoskeletal  (+) Arthritis ,   Abdominal Normal abdominal exam  (+)   Peds  Hematology negative hematology ROS (+)   Anesthesia Other Findings Past Medical History: No date: Allergic state No date: Anxiety No date: Arthritis No date: Asthma     Comment:  mild 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 No date: Cancer ([place])     Comment:  skin No date: Chickenpox No date: Coronary artery disease No date: Diverticulosis No date: Gastritis No date: GERD (gastroesophageal reflux disease) No date: Glaucoma No date: Hemorrhoids No date: History of hiatal hernia No date: Hives No date: HOH (hard of hearing) No date: Hyperlipemia No date: Hypertension No date: Wears dentures     Comment:  upper and lower  Past Surgical History: No date: BACK SURGERY No date: CARPAL TUNNEL RELEASE No date: COLONOSCOPY 04/11/2018: ESOPHAGOGASTRODUODENOSCOPY; N/A     Comment:  Procedure: ESOPHAGOGASTRODUODENOSCOPY (EGD);  Surgeon:               Lollie Sails, MD;  Location[facility] ENDOSCOPY;                Service:  Endoscopy;  Laterality: N/A; 08/27/2015: ESOPHAGOGASTRODUODENOSCOPY (EGD) WITH PROPOFOL; N/A     Comment:  Procedure: ESOPHAGOGASTRODUODENOSCOPY (EGD) WITH               PROPOFOL;  Surgeon: Lollie Sails, MD;  Location:               [facility] ENDOSCOPY;  Service: Endoscopy;  Laterality: N/A; No date: LUMBAR SPINE SURGERY  BMI    Body Mass Index: 24.89 kg/m      Reproductive/Obstetrics negative OB ROS                             Anesthesia Physical  Anesthesia Plan  ASA: 3  Anesthesia Plan: MAC   Post-op Pain Management: Minimal or no pain anticipated   Induction: Intravenous  PONV Risk Score and Plan:   Airway Management Planned: Natural Airway  Additional Equipment:   Intra-op Plan:   Post-operative Plan:   Informed Consent: I have reviewed the patients History and Physical, chart, labs and discussed the procedure including the risks, benefits and alternatives for the proposed anesthesia with the patient or authorized representative who has indicated his/her understanding and acceptance.     Dental Advisory Given  Plan Discussed with: Anesthesiologist, CRNA and Surgeon  Anesthesia Plan Comments: (Patient consented for risks of anesthesia including but not limited to:  - adverse reactions to medications - damage to eyes, teeth, lips or other oral mucosa - nerve damage due to positioning  -  sore throat or hoarseness - Damage to heart, brain, nerves, lungs, other parts of body or loss of life  Patient voiced understanding.)        Anesthesia Quick Evaluation

## 2022-04-13 NOTE — Transfer of Care (Signed)
Immediate Anesthesia Transfer of Care Note  Patient: Jason Bond  Procedure(s) Performed: CATARACT EXTRACTION PHACO AND INTRAOCULAR LENS PLACEMENT (IOC) RIGHT DIABETIC (Right: Eye)  Patient Location: PACU  Anesthesia Type: MAC  Level of Consciousness: awake, alert  and patient cooperative  Airway and Oxygen Therapy: Patient Spontanous Breathing and Patient connected to supplemental oxygen  Post-op Assessment: Post-op Vital signs reviewed, Patient's Cardiovascular Status Stable, Respiratory Function Stable, Patent Airway and No signs of Nausea or vomiting  Post-op Vital Signs: Reviewed and stable  Complications: No notable events documented.

## 2022-04-13 NOTE — H&P (Signed)
[facility]   Primary Care Physician:  [place] Pink, MD Ophthalmologist: Dr. Benay Pillow  Pre-Procedure History & Physical: HPI:  Jason Bond is a 81 y.o. male here for cataract surgery.   Past Medical History:  Diagnosis Date   Allergic state    Anxiety    Arthritis    Asthma    mild   Barrett esophagus    Cancer ([place])    skin   Chickenpox    Coronary artery disease    Diverticulosis    Gastritis    GERD (gastroesophageal reflux disease)    Glaucoma    Hemorrhoids    History of hiatal hernia    Hives    HOH (hard of hearing)    Hyperlipemia    Hypertension    Wears dentures    upper and lower    Past Surgical History:  Procedure Laterality Date   BACK SURGERY     CARPAL TUNNEL RELEASE     CATARACT EXTRACTION W/PHACO Left 03/30/2022   Procedure: CATARACT EXTRACTION PHACO AND INTRAOCULAR LENS PLACEMENT (IOC)LEFT HYDRUS MICROSTENT DIABETIC 4.27 00:33.2;  Surgeon: Eulogio Bear, MD;  Location: [place];  Service: Ophthalmology;  Laterality: Left;   COLONOSCOPY     ESOPHAGOGASTRODUODENOSCOPY N/A 04/11/2018   Procedure: ESOPHAGOGASTRODUODENOSCOPY (EGD);  Surgeon: Lollie Sails, MD;  Location: [facility] ENDOSCOPY;  Service: Endoscopy;  Laterality: N/A;   ESOPHAGOGASTRODUODENOSCOPY (EGD) WITH PROPOFOL N/A 08/27/2015   Procedure: ESOPHAGOGASTRODUODENOSCOPY (EGD) WITH PROPOFOL;  Surgeon: Lollie Sails, MD;  Location: [facility] ENDOSCOPY;  Service: Endoscopy;  Laterality: N/A;   LUMBAR SPINE SURGERY      Prior to Admission medications   Medication Sig Start Date End Date Taking? Authorizing Provider  acetaminophen (TYLENOL) 650 MG CR tablet Take 650 mg by mouth every 8 (eight) hours as needed for pain.   Yes [provider]  albuterol (VENTOLIN HFA) 108 (90 Base) MCG/ACT inhaler Inhale into the lungs every 6 (six) hours as needed for wheezing or shortness of breath.   Yes [provider]  Alpha-Lipoic Acid 300 MG TABS Take by  mouth 2 (two) times daily.   Yes [provider]  aspirin EC 81 MG tablet Take 81 mg by mouth daily.   Yes [provider]  atorvastatin (LIPITOR) 40 MG tablet Take 40 mg by mouth daily.   Yes [provider]  azelastine (ASTELIN) 0.1 % nasal spray Place 1 spray into both nostrils 2 (two) times daily. Use in each nostril as directed   Yes [provider]  brimonidine-timolol (COMBIGAN) 0.2-0.5 % ophthalmic solution Place 1 drop into both eyes every 12 (twelve) hours.   Yes [provider]  calcium carbonate (TUMS - DOSED IN MG ELEMENTAL CALCIUM) 500 MG chewable tablet Chew 1 tablet by mouth 2 (two) times daily with a meal.   Yes [provider]  celecoxib (CELEBREX) 200 MG capsule Take 200 mg by mouth 2 (two) times daily.   Yes [provider]  Cholecalciferol (VITAMIN D3) 50 MCG (2000 UT) TABS Take by mouth in the morning and at bedtime.   Yes [provider]  clobetasol cream (TEMOVATE) 0.86 % Apply 1 application topically 2 (two) times daily.   Yes [provider]  donepezil (ARICEPT) 5 MG tablet Take 2.5 mg by mouth daily with supper.   Yes [provider]  DULoxetine (CYMBALTA) 20 MG capsule Take 20 mg by mouth daily.   Yes [provider]  gabapentin (NEURONTIN) 100 MG  capsule Take 200 mg by mouth 3 (three) times daily.   Yes [provider]  hydrOXYzine (ATARAX) 25 MG tablet Take 25 mg by mouth 3 (three) times daily as needed.   Yes [provider]  latanoprost (XALATAN) 0.005 % ophthalmic solution Place 1 drop into both eyes at bedtime.   Yes [provider]  levocetirizine (XYZAL) 5 MG tablet Take 5 mg by mouth every evening.   Yes [provider]  lisinopril (ZESTRIL) 10 MG tablet Take 10 mg by mouth daily.   Yes [provider]  montelukast (SINGULAIR) 10 MG tablet Take 10 mg by mouth at bedtime.   Yes [provider]  Multiple  Vitamins-Minerals (ADVANCED EYE HEALTH PO) Take by mouth 2 (two) times daily.   Yes [provider]  OMEGA-3 FATTY ACIDS PO Take 1,000 mg by mouth daily.   Yes [provider]  omeprazole (PRILOSEC) 40 MG capsule Take 40 mg by mouth in the morning and at bedtime.   Yes [provider]  vitamin B-12 (CYANOCOBALAMIN) 500 MCG tablet Take 500 mcg by mouth daily.   Yes [provider]    Allergies as of 02/18/2022 - Review Complete 05/29/2020  Allergen Reaction Noted   Pravastatin sodium  07/12/2015    Family History  Family history unknown: Yes    Social History   Socioeconomic History   Marital status: Divorced    Spouse name: Not on file   Number of children: Not on file   Years of education: Not on file   Highest education level: Not on file  Occupational History   Not on file  Tobacco Use   Smoking status: Former    Types: Cigarettes    Quit date: 2000    Years since quitting: 23.7   Smokeless tobacco: Never  Vaping Use   Vaping Use: Never used  Substance and Sexual Activity   Alcohol use: No   Drug use: No   Sexual activity: Not on file  Other Topics Concern   Not on file  Social History Narrative   Not on file   Social Determinants of Health   Financial Resource Strain: Not on file  Food Insecurity: Not on file  Transportation Needs: Not on file  Physical Activity: Not on file  Stress: Not on file  Social Connections: Not on file  Intimate Partner Violence: Not on file    Review of Systems: See HPI, otherwise negative ROS  Physical Exam: BP (!) 180/87   Pulse 67   Temp 98.1 F (36.7 C) (Temporal)   Resp 18   Ht '5\' 4"'$  (1.626 m)   Wt 70.3 kg   SpO2 99%   BMI 26.61 kg/m  General:   Alert, cooperative in NAD Head:  Normocephalic and atraumatic. Respiratory:  Normal work of breathing. Cardiovascular:  RRR  Impression/Plan: Jason Bond is here for cataract surgery.  Risks, benefits, limitations, and  alternatives regarding cataract surgery have been reviewed with the patient.  Questions have been answered.  All parties agreeable.   Benay Pillow, MD  04/13/2022, 9:05 AM

## 2022-04-13 NOTE — Op Note (Addendum)
OPERATIVE NOTE  EVERTT CHOUINARD [id number] 04/13/2022  PREOPERATIVE DIAGNOSIS:   1.  Mild PRIMARY open angle glaucoma, right eye. H40.1111  2.  Nuclear sclerotic cataract right eye.  H25.11   POSTOPERATIVE DIAGNOSIS:    same.   PROCEDURE:   1.  Placement of trabecular bypass stent (hydrus) and phacoemusification with posterior chamber intraocular lens placement of the right eye  CPT [phone]   LENS: Implant Name Type Inv. Item Serial No. Manufacturer Lot No. LRB No. Used Action  LENS IOL TECNIS EYHANCE 20.0 - [id number] Intraocular Lens LENS IOL TECNIS EYHANCE 20.0 [id number] [facility]  Right 1 Implanted  IVANTIS HYDRUS MICROSTENT - [id number] Stent IVANTIS HYDRUS MICROSTENT  [facility] [id number] Right 1 Implanted      Procedure(s) with comments: CATARACT EXTRACTION PHACO AND INTRAOCULAR LENS PLACEMENT (IOC) RIGHT DIABETIC (Right) - 3.36 00:28.9  DIB00 +20.0   ULTRASOUND TIME: 0 minutes 28 seconds.  CDE 3.36   SURGEON:  Benay Pillow, MD, MPH  ANESTHESIOLOGIST: Anesthesiologist: Ilene Qua, MD CRNA: Moises Blood, CRNA   ANESTHESIA:  MAC and intracameral preservative-free lidocaine 4%.  ESTIMATED BLOOD LOSS: less than 1 mL.   COMPLICATIONS:  None.   DESCRIPTION OF PROCEDURE:  The patient was identified in the holding room and transported to the operating room.   The patient was placed in the supine position under the operating microscope.  The right eye was prepped and draped in the usual sterile ophthalmic fashion.   A 1.0 millimeter clear-corneal paracentesis was made at the 10:30 position and a second paracentesis at 7:00.  0.5 ml of preservative-free 1% lidocaine with epinephrine was injected into the anterior chamber.  The anterior chamber was filled with viscoelastic.  A 2.4 millimeter keratome was used to make a near-clear corneal incision at the 8:00 position.   Attention was turned to the hydrus.  The patients head was turned to the left and the microscope  was tilted to 035 degrees.  Ocular instruments/Glaukos OAL/H2 gonioprism was used coupled with viscoelastic on the cornea was used to visualize the trabecular meshwork. The hydrus was opened and introduced into the eye.  The meshwork was engaged with the tip of the injector and the hydrus stent was deployed into Schlemm's canal at 4:00.  The stent was well seated and in good position.  Next, attention was turned to the phacoemulsification A curvilinear capsulorrhexis was made with a cystotome and capsulorrhexis forceps.  Balanced salt solution was used to hydrodissect and hydrodelineate the nucleus.   Phacoemulsification was then used in stop and chop fashion to remove the lens nucleus and epinucleus.  The remaining cortex was then removed using the irrigation and aspiration handpiece. Viscoelastic was then placed into the capsular bag to distend it for lens placement.  A lens was then injected into the capsular bag.  The remaining viscoelastic was aspirated.   Wounds were hydrated with balanced salt solution.  The anterior chamber was inflated to a physiologic pressure with balanced salt solution.   Intracameral vigamox 0.1 mL undiluted was injected into the eye and a drop placed onto the ocular surface.  No wound leaks were noted. The patient was taken to the recovery room in stable condition without complications of anesthesia or surgery   Benay Pillow 04/13/2022, 9:42 AM

## 2022-04-13 NOTE — Anesthesia Postprocedure Evaluation (Signed)
Anesthesia Post Note  Patient: Jason Bond  Procedure(s) Performed: CATARACT EXTRACTION PHACO AND INTRAOCULAR LENS PLACEMENT (IOC) RIGHT DIABETIC (Right: Eye)  Patient location during evaluation: PACU Anesthesia Type: MAC Level of consciousness: awake and alert Pain management: pain level controlled Vital Signs Assessment: post-procedure vital signs reviewed and stable Respiratory status: spontaneous breathing, nonlabored ventilation, respiratory function stable and patient connected to nasal cannula oxygen Cardiovascular status: stable and blood pressure returned to baseline Postop Assessment: no apparent nausea or vomiting Anesthetic complications: no   No notable events documented.   Last Vitals:  Vitals:   04/13/22 0826 04/13/22 0944  BP: (!) 180/87 (!) 140/81  Pulse: 67 77  Resp: 18 13  Temp: 36.7 C 36.7 C  SpO2: 99% 99%    Last Pain:  Vitals:   04/13/22 0944  TempSrc:   PainSc: 0-No pain                 Ilene Qua

## 2022-04-14 ENCOUNTER — Encounter: Payer: Self-pay | Admitting: Ophthalmology

## 2022-09-08 ENCOUNTER — Other Ambulatory Visit: Payer: Self-pay | Admitting: *Deleted

## 2022-09-08 ENCOUNTER — Encounter: Payer: Self-pay | Admitting: Urology

## 2022-09-08 ENCOUNTER — Other Ambulatory Visit
Admission: RE | Admit: 2022-09-08 | Discharge: 2022-09-08 | Disposition: A | Discharge status: Home / Self Care | Payer: Medicare Other | Attending: Urology | Admitting: Urology

## 2022-09-08 ENCOUNTER — Ambulatory Visit: Payer: Medicare Other | Admitting: Urology

## 2022-09-08 VITALS — BP 127/72 | HR 116 | Ht 64.0 in | Wt 168.8 lb

## 2022-09-08 DIAGNOSIS — R972 Elevated prostate specific antigen [PSA]: Secondary | ICD-10-CM

## 2022-09-08 DIAGNOSIS — R350 Frequency of micturition: Secondary | ICD-10-CM | POA: Diagnosis present

## 2022-09-08 DIAGNOSIS — R399 Unspecified symptoms and signs involving the genitourinary system: Secondary | ICD-10-CM

## 2022-09-08 DIAGNOSIS — R82998 Other abnormal findings in urine: Secondary | ICD-10-CM

## 2022-09-08 DIAGNOSIS — N3000 Acute cystitis without hematuria: Secondary | ICD-10-CM

## 2022-09-08 DIAGNOSIS — L539 Erythematous condition, unspecified: Secondary | ICD-10-CM

## 2022-09-08 LAB — URINALYSIS, COMPLETE (UACMP) WITH MICROSCOPIC
Bilirubin Urine: NEGATIVE
Glucose, UA: NEGATIVE mg/dL
Hgb urine dipstick: NEGATIVE
Nitrite: NEGATIVE
Specific Gravity, Urine: 1.02 (ref 1.005–1.030)
WBC, UA: >50 WBC/hpf (ref 0–5)
pH: 5.5 (ref 5.0–8.0)

## 2022-09-08 MED ORDER — SULFAMETHOXAZOLE-TRIMETHOPRIM 800-160 MG PO TABS: 1.0000 | ORAL_TABLET | Freq: Two times a day (BID) | ORAL | 0 refills | Status: DC

## 2022-09-08 NOTE — Patient Instructions (Signed)
Urinary Tract Infection, Adult  A urinary tract infection (UTI) is an infection of any part of the urinary tract. The urinary tract includes the kidneys, ureters, bladder, and urethra. These organs make, store, and get rid of urine in the body. An upper UTI affects the ureters and kidneys. A lower UTI affects the bladder and urethra. What are the causes? Most urinary tract infections are caused by bacteria in your genital area around your urethra, where urine leaves your body. These bacteria grow and cause inflammation of your urinary tract. What increases the risk? You are more likely to develop this condition if: You have a urinary catheter that stays in place. You are not able to control when you urinate or have a bowel movement (incontinence). You are male and you: Use a spermicide or diaphragm for birth control. Have low estrogen levels. Are pregnant. You have certain genes that increase your risk. You are sexually active. You take antibiotic medicines. You have a condition that causes your flow of urine to slow down, such as: An enlarged prostate, if you are male. Blockage in your urethra. A kidney stone. A nerve condition that affects your bladder control (neurogenic bladder). Not getting enough to drink, or not urinating often. You have certain medical conditions, such as: Diabetes. A weak disease-fighting system (immunesystem). Sickle cell disease. Gout. Spinal cord injury. What are the signs or symptoms? Symptoms of this condition include: Needing to urinate right away (urgency). Frequent urination. This may include small amounts of urine each time you urinate. Pain or burning with urination. Blood in the urine. Urine that smells bad or unusual. Trouble urinating. Cloudy urine. Vaginal discharge, if you are male. Pain in the abdomen or the lower back. You may also have: Vomiting or a decreased appetite. Confusion. Irritability or tiredness. A fever or  chills. Diarrhea. The first symptom in older adults may be confusion. In some cases, they may not have any symptoms until the infection has worsened. How is this diagnosed? This condition is diagnosed based on your medical history and a physical exam. You may also have other tests, including: Urine tests. Blood tests. Tests for STIs (sexually transmitted infections). If you have had more than one UTI, a cystoscopy or imaging studies may be done to determine the cause of the infections. How is this treated? Treatment for this condition includes: Antibiotic medicine. Over-the-counter medicines to treat discomfort. Drinking enough water to stay hydrated. If you have frequent infections or have other conditions such as a kidney stone, you may need to see a health care provider who specializes in the urinary tract (urologist). In rare cases, urinary tract infections can cause sepsis. Sepsis is a life-threatening condition that occurs when the body responds to an infection. Sepsis is treated in the hospital with IV antibiotics, fluids, and other medicines. Follow these instructions at home:  Medicines Take over-the-counter and prescription medicines only as told by your health care provider. If you were prescribed an antibiotic medicine, take it as told by your health care provider. Do not stop using the antibiotic even if you start to feel better. General instructions Make sure you: Empty your bladder often and completely. Do not hold urine for long periods of time. Empty your bladder after sex. Wipe from front to back after urinating or having a bowel movement if you are male. Use each tissue only one time when you wipe. Drink enough fluid to keep your urine pale yellow. Keep all follow-up visits. This is important. Contact a health   care provider if: Your symptoms do not get better after 1-2 days. Your symptoms go away and then return. Get help right away if: You have severe pain in  your back or your lower abdomen. You have a fever or chills. You have nausea or vomiting. Summary A urinary tract infection (UTI) is an infection of any part of the urinary tract, which includes the kidneys, ureters, bladder, and urethra. Most urinary tract infections are caused by bacteria in your genital area. Treatment for this condition often includes antibiotic medicines. If you were prescribed an antibiotic medicine, take it as told by your health care provider. Do not stop using the antibiotic even if you start to feel better. Keep all follow-up visits. This is important. This information is not intended to replace advice given to you by your health care provider. Make sure you discuss any questions you have with your health care provider. Document Revised: 02/02/2020 Document Reviewed: 02/02/2020 Elsevier Patient Education  2023 Elsevier Inc.  

## 2022-09-08 NOTE — Progress Notes (Signed)
09/08/22 3:11 PM   Jason Bond 1940/09/04 [id number]  CC: Urinary symptoms, PSA screening, groin lesion  HPI: 82 year old male with some dementia who made an appointment for the above issues today.  His primary issue today is foul-smelling urine and urinary urgency/frequency over the last 1 to 2 weeks.  He also reports a tender lesion in the right groin that he has been putting hydrogen peroxide on it has improved over the last week or so.  He also has a very long history of stable PSA ranging around 5-5.5 over the last 5 years.  He denies any gross hematuria or flank pain.  Denies fevers or chills.  Urinalysis today appears grossly infected with 0-5 RBC, greater than 50 WBC, many bacteria, moderate leukocytes.  Will send for culture.  PVR normal at 26m.   PMH: Past Medical History:  Diagnosis Date   Allergic state    Anxiety    Arthritis    Asthma    mild   Barrett esophagus    [place]cer [place]    skin   Chickenpox    Coronary artery disease    Diverticulosis    Gastritis    GERD (gastroesophageal reflux disease)    Glaucoma    Hemorrhoids    History of hiatal hernia    Hives    HOH (hard of hearing)    Hyperlipemia    Hypertension    Wears dentures    upper and lower    Surgical History: Past Surgical History:  Procedure Laterality Date   BACK SURGERY     CARPAL TUNNEL RELEASE     CATARACT EXTRACTION W/PHACO Left 03/30/2022   Procedure: CATARACT EXTRACTION PHACO AND INTRAOCULAR LENS PLACEMENT (IOC)LEFT HYDRUS MICROSTENT DIABETIC 4.27 00:33.2;  Surgeon: KEulogio Bear MD;  [place]  Service: Ophthalmology;  Laterality: Left;   CATARACT EXTRACTION W/PHACO Right 04/13/2022   Procedure: CATARACT EXTRACTION PHACO AND INTRAOCULAR LENS PLACEMENT ([place] RIGHT DIABETIC;  Surgeon: KEulogio Bear MD;  [place]  Service: Ophthalmology;  Laterality: Right;  3.36 00:28.9   COLONOSCOPY     ESOPHAGOGASTRODUODENOSCOPY N/A  04/11/2018   Procedure: ESOPHAGOGASTRODUODENOSCOPY (EGD);  Surgeon: SLollie Sails MD;  [facility]  Service: Endoscopy;  Laterality: N/A;   ESOPHAGOGASTRODUODENOSCOPY (EGD) WITH PROPOFOL N/A 08/27/2015   Procedure: ESOPHAGOGASTRODUODENOSCOPY (EGD) WITH PROPOFOL;  Surgeon: MLollie Sails MD;  [facility]  Service: Endoscopy;  Laterality: N/A;   LUMBAR SPINE SURGERY      Family History: Family History  Family history unknown: Yes    Social History:  reports that he quit smoking about 24 years ago. His smoking use included cigarettes. He has never used smokeless tobacco. He reports that he does not drink alcohol and does not use drugs.  Physical Exam: BP 127/72 (BP Location: Left Arm, Patient Position: Sitting, Cuff Size: Normal)   Pulse (!) 116   Ht '5\' 4"'$  (1.626 m)   Wt 168 lb 12.8 oz (76.6 kg)   BMI 28.97 kg/m    Constitutional:  Alert and oriented, No acute distress. Cardiovascular: No clubbing, cyanosis, or edema. Respiratory: Normal respiratory effort, no increased work of breathing. GI: Abdomen is soft, nontender, nondistended, no abdominal masses GU: Uncircumcised phallus with patent meatus, no penile lesions.  There is a small 1 cm area of erythema in the right groin that looks like a healed/resolved ingrown hair, no induration, drainage, or tenderness  Assessment & Plan:   82year old male with UTI symptoms  and urinalysis today consistent with infection, emptying appropriately with normal PVR of 51m, likely resolved ingrown hair in the scrotum with no worrisome findings for abscess on exam today.  Regarding PSA screening, we reviewed the AUA guidelines that do not recommend routine screening in men over age 82 especially those with other comorbidities like dementia, his PSA trend has been very reassuring over the last 5 years and stable at around 5, would not recommend further PSA screening or any intervention or biopsy.  Return precautions  were discussed, consider further workup with CT urogram if recurrent infections, but he denies any history of UTI.  Bactrim DS twice daily for UTI, follow-up urine culture Discontinue PSA screening per guideline recommendations  BNickolas Madrid MD 09/08/2022  [facility] [address] [place] [phone]

## 2022-09-10 LAB — URINE CULTURE: Culture: >=100000 — AB

## 2022-12-29 ENCOUNTER — Other Ambulatory Visit: Payer: Self-pay | Admitting: *Deleted

## 2022-12-29 ENCOUNTER — Other Ambulatory Visit
Admission: RE | Admit: 2022-12-29 | Discharge: 2022-12-29 | Disposition: A | Discharge status: Home / Self Care | Payer: Medicare Other | Attending: Urology | Admitting: Urology

## 2022-12-29 ENCOUNTER — Ambulatory Visit: Payer: Medicare Other | Admitting: Urology

## 2022-12-29 ENCOUNTER — Encounter: Payer: Self-pay | Admitting: Urology

## 2022-12-29 VITALS — BP 144/71 | HR 114 | Ht 64.0 in | Wt 166.0 lb

## 2022-12-29 DIAGNOSIS — B356 Tinea cruris: Secondary | ICD-10-CM

## 2022-12-29 DIAGNOSIS — R21 Rash and other nonspecific skin eruption: Secondary | ICD-10-CM

## 2022-12-29 DIAGNOSIS — R3989 Other symptoms and signs involving the genitourinary system: Secondary | ICD-10-CM | POA: Diagnosis present

## 2022-12-29 DIAGNOSIS — R350 Frequency of micturition: Secondary | ICD-10-CM

## 2022-12-29 LAB — URINALYSIS, COMPLETE (UACMP) WITH MICROSCOPIC
Bilirubin Urine: NEGATIVE
Glucose, UA: NEGATIVE mg/dL
Ketones, ur: NEGATIVE mg/dL
Leukocytes,Ua: NEGATIVE
Nitrite: NEGATIVE
Protein, ur: NEGATIVE mg/dL
Specific Gravity, Urine: 1.01 (ref 1.005–1.030)
Squamous Epithelial / HPF: NONE SEEN /HPF (ref 0–5)
pH: 5.5 (ref 5.0–8.0)

## 2022-12-29 MED ORDER — CLOTRIMAZOLE 1 % EX CREA: 1.0000 | TOPICAL_CREAM | Freq: Two times a day (BID) | CUTANEOUS | 1 refills | Status: DC

## 2022-12-29 NOTE — Progress Notes (Signed)
   12/29/2022 2:46 PM   Jason Bond 06/13/1941 [id number]  Reason for visit: Groin rash  HPI: 82 year old male who made an appointment for a rash in the right groin.  I saw him last in March 2024 when he had UTI symptoms of urgency/frequency and foul-smelling urine, urine at that time grew E. coli and he was treated with antibiotics.  He is a challenging historian with his dementia, but he denies any urinary symptoms or gross hematuria today.  On exam, uncircumcised phallus with no lesions, no evidence of balanitis, testicles 20 cc and descended bilaterally, 5 cm x 2 cm erythematous rash in the right groin crease most consistent with tinea cruris  Urinalysis today benign  Clotrimazole cream for suspected tinea cruris in the right groin  Sondra Come, MD  [facility] [address], [address] [place], [place] [address] [phone]

## 2022-12-31 LAB — URINE CULTURE

## 2023-04-19 ENCOUNTER — Other Ambulatory Visit: Payer: Self-pay | Admitting: Student

## 2023-04-19 DIAGNOSIS — F0150 Vascular dementia without behavioral disturbance: Secondary | ICD-10-CM

## 2023-04-30 ENCOUNTER — Ambulatory Visit
Admission: RE | Admit: 2023-04-30 | Discharge: 2023-04-30 | Disposition: A | Discharge status: Home / Self Care | Payer: Medicare Other | Source: Ambulatory Visit | Attending: Student | Admitting: Student

## 2023-04-30 DIAGNOSIS — G309 Alzheimer's disease, unspecified: Secondary | ICD-10-CM | POA: Diagnosis present

## 2023-04-30 DIAGNOSIS — F0150 Vascular dementia without behavioral disturbance: Secondary | ICD-10-CM | POA: Insufficient documentation

## 2023-04-30 DIAGNOSIS — F0280 Dementia in other diseases classified elsewhere without behavioral disturbance: Secondary | ICD-10-CM | POA: Diagnosis present

## 2023-09-09 ENCOUNTER — Other Ambulatory Visit: Payer: Self-pay | Admitting: Gastroenterology

## 2023-09-09 DIAGNOSIS — R1314 Dysphagia, pharyngoesophageal phase: Secondary | ICD-10-CM

## 2023-09-21 ENCOUNTER — Ambulatory Visit
Admission: RE | Admit: 2023-09-21 | Discharge: 2023-09-21 | Disposition: A | Discharge status: Home / Self Care | Source: Ambulatory Visit | Attending: Gastroenterology | Admitting: Gastroenterology

## 2023-09-21 DIAGNOSIS — R1314 Dysphagia, pharyngoesophageal phase: Secondary | ICD-10-CM | POA: Insufficient documentation

## 2024-06-27 ENCOUNTER — Ambulatory Visit: Admitting: Urology

## 2024-06-27 VITALS — BP 109/65 | HR 104 | Wt 162.2 lb

## 2024-06-27 DIAGNOSIS — Z125 Encounter for screening for malignant neoplasm of prostate: Secondary | ICD-10-CM

## 2024-06-27 NOTE — Patient Instructions (Signed)
 Understanding PSA Screening  What is PSA Screening? PSA stands for Prostate-Specific Antigen. It is a protein made by the prostate gland.  PSA is made by normal prostate tissue, but can be elevated in patients with prostate cancer.  There are other factors that can cause an increased PSA besides prostate cancer including an enlarged prostate(BPH), recent ejaculation, infection(prostatitis), inflammation, recent illness or procedure.  A normal PSA level is less than 4 for men under age 2. Over 70-75 PSA levels are normal below ~7.5.  Why is PSA Screening Important? Prostate cancer is a common cancer in men, by finding prostate cancer early before patients have symptoms we can often cure this with either surgery or radiation.  Some prostate cancers grow very slowly and do not need any intervention and can be safely monitored(active surveillance).  Our goal is to find those more aggressive prostate cancers that if untreated would spread outside the prostate and cause symptoms or death.  If prostate cancer spreads outside the prostate, it cannot usually be cured, but multiple treatments are available to slow the growth of cancer and prolong life.  Who Should Get Tested? Most patients should start PSA testing around age 52 or 30, however high risk patients with a family history of prostate cancer, African American descent, patients with a strong family history of breast cancer should consider screening earlier starting at age 44. After age 56, the risks of screening start outweigh the benefits, and routine screening is not recommended after age 11.  This is because even if you develop prostate cancer in your 79s, 13s, or 90s it tends to grow so slowly that it would not cause symptoms or problems.  What Are the Benefits?    Early detection of prostate cancer More treatment options if cancer is found  Options if you have an elevated PSA: First, a confirmatory second PSA level will always be checked to  confirm elevation, as false elevations are common and we want to avoid any invasive testing if possible If you have 2 elevated PSA levels, options include:  PHI(prostate health index) score: (fancy PSA blood test), this can help determine your risk of prostate cancer if your PSA is mildly elevated between 4 and 10 before moving to more expensive/invasive testing Prostate MRI: Imaging test that can detect areas suspicious for prostate cancer, if MRI is completely normal can potentially avoid a prostate biopsy Prostate biopsy: 10 to 15-minute procedure performed in clinic, can be uncomfortable, 1 to 2% chance of serious bleeding or infection, best test to determine if prostate cancer is present but more invasive   What Are the Risks or Downsides?    Prostate biopsy can be uncomfortable with a small risk of bleeding or infection Some prostate cancers grow very slowly and might never cause problems, and detection may lead to unnecessary treatments Possible side effects from follow-up tests or treatments  ---------------------------------------------------  The Benefits of a Plant-Based Diet for Urology Health  A plant-based diet emphasizes the consumption of whole, unprocessed plant foods while minimizing or excluding animal products including meat and dairy products. This dietary approach has gained attention for its potential to promote overall health, including urology-related conditions. Incorporating a plant-based diet into your lifestyle can offer numerous benefits for maintaining optimal urology health.  1. Reduced Risk of Kidney Stones: A plant-based diet is typically rich in fruits, vegetables, legumes, and whole grains. These foods are high in dietary fiber, potassium, and magnesium, which can help reduce the risk of developing kidney stones.  Be careful to avoid high quantities of spinach, as these can contribute to kidney stone formation if eaten in large volumes. The increased intake  of water-soluble fiber can enhance the excretion of waste products and prevent the crystallization of minerals that lead to stone formation.  2. Improved Prostate Health: Studies have suggested a link between the consumption of red and processed meats and an increased risk of prostate problems, including benign prostatic hyperplasia (BPH) and prostate cancer. By adopting a plant-based diet, you can lower your intake of saturated fats and decrease the risk of these conditions. PSA levels can often decrease on plant based diets! Plant foods are also rich in antioxidants and phytochemicals that have been associated with prostate health.  3. Better Bladder Function: A diet focused on plant-based foods can contribute to better bladder health by reducing the risk of urinary tract infections (UTIs). Berries, citrus fruits, and leafy greens are known for their high vitamin C content, which can acidify urine and create an environment less favorable for bacteria growth. Additionally, plant-based diets are generally lower in sodium, which can help prevent fluid retention and reduce the strain on the bladder.  4. Management of Erectile Dysfunction (ED): Some research suggests that a plant-based diet can positively impact erectile function. Plant-based diets are associated with improved cardiovascular health, which is crucial for maintaining healthy blood flow and nerve function required for proper erectile function. By reducing the consumption of high-cholesterol and high-saturated fat animal products, a plant-based diet may contribute to a decreased risk of ED.  5. Prevention of Chronic Conditions: A plant-based diet can help prevent or manage chronic conditions such as obesity, diabetes, and hypertension. These conditions can contribute to urology-related issues, including urinary incontinence and kidney dysfunction. By maintaining a healthy weight and managing these conditions, you can reduce the risk of  urology-related complications.  Conclusion: Embracing a plant-based diet can offer significant benefits for urology health. By incorporating a variety of colorful fruits, vegetables, whole grains, nuts, seeds, and legumes into your meals, you can support kidney health, prostate health, bladder function, and overall well-being. Remember to consult with a healthcare professional or registered dietitian before making any significant dietary changes, especially if you have existing health conditions. Your personalized approach to a plant-based diet can contribute to improved urology health and enhance your quality of life.

## 2024-06-27 NOTE — Progress Notes (Signed)
" ° °  06/27/2024 11:27 AM   Jason Bond 07-10-1940 [id number]  Reason for visit: PSA screening  History: Previously seen in March and June 2024 for PSA that had ranged from 5-7 over the last 5 years, as well as a groin rash His sister made an appointment today to discuss PSA values, most recent was 7 from December 2025 Has Alzheimer's dementia  Physical Exam: BP 109/65 (BP Location: Left Arm, Patient Position: Sitting, Cuff Size: Normal)   Pulse (!) 104   Wt 162 lb 3.2 oz (73.6 kg)   SpO2 98%   BMI 27.84 kg/m   Imaging/labs: PSA history reviewed-has varied between 5 and 7 over the last 5 years, including 6.12 April 2021, 5.04 September 2021, 09 April 2022, 11 June 2024 Recent urinalysis benign  Today: No significant urinary complaints aside from some urinary frequency with high fluid intake, denies any dysuria or gross hematuria  Plan:   PSA screening: We reviewed the AUA guidelines that do not recommend routine screening in men over age 88, and that his PSA trend has overall been very stable over the last 5 years, would not recommend further evaluation or screening as risks likely outweigh any benefit.  Would not recommend further PSA screening, can follow-up with urology as needed   Jason JAYSON Burnet, MD  [facility] [address] [place], [place] [address] [phone]  "
